# Patient Record
Sex: Male | Born: 1980
Health system: Southern US, Community
[De-identification: ages and names within clinical notes are randomized; demographics above are authoritative.]

## PROBLEM LIST (undated history)

## (undated) DIAGNOSIS — G8929 Other chronic pain: Secondary | ICD-10-CM

## (undated) DIAGNOSIS — M549 Dorsalgia, unspecified: Secondary | ICD-10-CM

## (undated) DIAGNOSIS — M5126 Other intervertebral disc displacement, lumbar region: Secondary | ICD-10-CM

---

## 2005-04-12 ENCOUNTER — Emergency Department (HOSPITAL_COMMUNITY): Admission: EM | Admit: 2005-04-12 | Discharge: 2005-04-12 | Payer: Self-pay | Admitting: Emergency Medicine

## 2005-06-24 ENCOUNTER — Emergency Department (HOSPITAL_COMMUNITY): Admission: EM | Admit: 2005-06-24 | Discharge: 2005-06-24 | Payer: Self-pay | Admitting: Emergency Medicine

## 2005-09-28 ENCOUNTER — Emergency Department (HOSPITAL_COMMUNITY): Admission: EM | Admit: 2005-09-28 | Discharge: 2005-09-28 | Payer: Self-pay | Admitting: Emergency Medicine

## 2007-08-06 ENCOUNTER — Emergency Department (HOSPITAL_COMMUNITY): Admission: EM | Admit: 2007-08-06 | Discharge: 2007-08-06 | Payer: Self-pay | Admitting: Emergency Medicine

## 2009-03-04 ENCOUNTER — Emergency Department (HOSPITAL_COMMUNITY): Admission: EM | Admit: 2009-03-04 | Discharge: 2009-03-04 | Payer: Self-pay | Admitting: Emergency Medicine

## 2009-10-06 ENCOUNTER — Emergency Department (HOSPITAL_COMMUNITY): Admission: EM | Admit: 2009-10-06 | Discharge: 2009-10-07 | Payer: Self-pay | Admitting: Emergency Medicine

## 2011-01-11 ENCOUNTER — Emergency Department (HOSPITAL_COMMUNITY)
Admission: EM | Admit: 2011-01-11 | Discharge: 2011-01-11 | Disposition: A | Discharge status: Home / Self Care | Payer: Medicaid Other | Attending: Emergency Medicine | Admitting: Emergency Medicine

## 2011-01-11 ENCOUNTER — Emergency Department (HOSPITAL_COMMUNITY): Payer: Medicaid Other

## 2011-01-11 DIAGNOSIS — S6990XA Unspecified injury of unspecified wrist, hand and finger(s), initial encounter: Secondary | ICD-10-CM | POA: Insufficient documentation

## 2011-01-11 DIAGNOSIS — W2209XA Striking against other stationary object, initial encounter: Secondary | ICD-10-CM | POA: Insufficient documentation

## 2011-01-11 DIAGNOSIS — M25549 Pain in joints of unspecified hand: Secondary | ICD-10-CM | POA: Insufficient documentation

## 2011-01-11 DIAGNOSIS — Y92009 Unspecified place in unspecified non-institutional (private) residence as the place of occurrence of the external cause: Secondary | ICD-10-CM | POA: Insufficient documentation

## 2011-08-14 ENCOUNTER — Emergency Department (HOSPITAL_COMMUNITY): Payer: Medicaid Other

## 2011-08-14 ENCOUNTER — Emergency Department (HOSPITAL_COMMUNITY)
Admission: EM | Admit: 2011-08-14 | Discharge: 2011-08-15 | Disposition: A | Discharge status: Home / Self Care | Payer: Medicaid Other | Attending: Emergency Medicine | Admitting: Emergency Medicine

## 2011-08-14 ENCOUNTER — Encounter: Payer: Self-pay | Admitting: *Deleted

## 2011-08-14 DIAGNOSIS — R630 Anorexia: Secondary | ICD-10-CM | POA: Insufficient documentation

## 2011-08-14 DIAGNOSIS — R6883 Chills (without fever): Secondary | ICD-10-CM | POA: Insufficient documentation

## 2011-08-14 DIAGNOSIS — R112 Nausea with vomiting, unspecified: Secondary | ICD-10-CM | POA: Insufficient documentation

## 2011-08-14 DIAGNOSIS — R109 Unspecified abdominal pain: Secondary | ICD-10-CM | POA: Insufficient documentation

## 2011-08-14 DIAGNOSIS — F17200 Nicotine dependence, unspecified, uncomplicated: Secondary | ICD-10-CM | POA: Insufficient documentation

## 2011-08-14 DIAGNOSIS — R197 Diarrhea, unspecified: Secondary | ICD-10-CM | POA: Insufficient documentation

## 2011-08-14 LAB — URINALYSIS, ROUTINE W REFLEX MICROSCOPIC
Hgb urine dipstick: NEGATIVE
Protein, ur: NEGATIVE mg/dL
Urobilinogen, UA: 0.2 mg/dL (ref 0.0–1.0)

## 2011-08-14 MED ORDER — SODIUM CHLORIDE 0.9 % IV BOLUS (SEPSIS)
1000.0000 mL | Freq: Once | INTRAVENOUS | Status: AC
  Administered 2011-08-14: 1000 mL via INTRAVENOUS

## 2011-08-14 MED ORDER — ONDANSETRON HCL 4 MG/2ML IJ SOLN
4.0000 mg | Freq: Once | INTRAMUSCULAR | Status: AC
  Administered 2011-08-14: 4 mg via INTRAVENOUS
  Filled 2011-08-14: qty 2

## 2011-08-14 MED ORDER — MORPHINE SULFATE 2 MG/ML IJ SOLN
2.0000 mg | Freq: Once | INTRAMUSCULAR | Status: AC
  Administered 2011-08-14: 2 mg via INTRAVENOUS
  Filled 2011-08-14: qty 1

## 2011-08-14 MED ORDER — DIPHENOXYLATE-ATROPINE 2.5-0.025 MG PO TABS
2.0000 | ORAL_TABLET | Freq: Once | ORAL | Status: AC
  Administered 2011-08-14: 2 via ORAL
  Filled 2011-08-14: qty 2

## 2011-08-14 MED ORDER — PANTOPRAZOLE SODIUM 40 MG IV SOLR
40.0000 mg | Freq: Once | INTRAVENOUS | Status: AC
  Administered 2011-08-14: 40 mg via INTRAVENOUS
  Filled 2011-08-14: qty 40

## 2011-08-14 NOTE — ED Notes (Signed)
Pt reports abdominal cramping at this time, had n/v this morning, & diarrhea this evening.

## 2011-08-14 NOTE — ED Provider Notes (Signed)
History     CSN: [id number] Arrival date & time: 08/14/2011  9:43 PM  Chief Complaint  Patient presents with  . Abdominal Pain    (Consider location/radiation/quality/duration/timing/severity/associated sxs/prior treatment) HPI Comments: Seen 2210  Patient is a 30 y.o. male presenting with abdominal pain. The history is provided by the patient.  Abdominal Pain The primary symptoms of the illness include abdominal pain, nausea and diarrhea. The current episode started 6 to 12 hours ago (began this morning at 7 am as he was leaving work).  The illness is associated with retching. The patient has had a change in bowel habit. Additional symptoms associated with the illness include chills and anorexia. Symptoms associated with the illness do not include diaphoresis, heartburn, constipation, urgency, hematuria, frequency or back pain.    History reviewed. No pertinent past medical history.  History reviewed. No pertinent past surgical history.  No family history on file.  History  Substance Use Topics  . Smoking status: Current Everyday Smoker  . Smokeless tobacco: Not on file  . Alcohol Use: Yes      Review of Systems  Constitutional: Positive for chills. Negative for diaphoresis.  Gastrointestinal: Positive for nausea, abdominal pain, diarrhea and anorexia. Negative for heartburn and constipation.  Genitourinary: Negative for urgency, frequency and hematuria.  Musculoskeletal: Negative for back pain.  All other systems reviewed and are negative.    Allergies  Ibuprofen  Home Medications  No current outpatient prescriptions on file.  BP 106/50  Pulse 81  Temp(Src) 98.4 F (36.9 C) (Oral)  Resp 16  Ht 5\' 7"  (1.702 m)  Wt 153 lb (69.4 kg)  BMI 23.96 kg/m2  SpO2 100%  Physical Exam  Nursing note and vitals reviewed. Constitutional: He is oriented to person, place, and time. He appears well-developed and well-nourished.  HENT:  Head: Normocephalic and atraumatic.   Eyes: EOM are normal.  Neck: Normal range of motion.  Cardiovascular: Normal rate, normal heart sounds and intact distal pulses.   Pulmonary/Chest: Effort normal and breath sounds normal.  Abdominal: Soft. He exhibits no mass. There is tenderness. There is no rebound and no guarding.       Mild epigastric discomfort  Musculoskeletal: Normal range of motion.  Neurological: He is alert and oriented to person, place, and time.  Skin: Skin is warm and dry.    ED Course  Procedures (including critical care time)  Labs Reviewed  URINALYSIS, ROUTINE W REFLEX MICROSCOPIC - Abnormal; Notable for the following:    Specific Gravity, Urine >1.030 (*)    All other components within normal limits   Dg Abd Acute W/chest  08/14/2011  *RADIOLOGY REPORT*  Clinical Data: Abdominal pain, nausea and vomiting for 1 day. 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  ACUTE ABDOMEN SERIES (ABDOMEN 2 VIEW & CHEST 1 VIEW)  Comparison: None.  Findings: Calcified granulomas in the lungs. Normal heart size and pulmonary vascularity.  No focal consolidation in the lungs.  No blunting of costophrenic angles.  Scattered gas and stool in the colon.  No small or large bowel dilatation.  No free intra-abdominal air.  No abnormal air fluid levels.  No radiopaque stones.  IMPRESSION: No evidence of active pulmonary disease.  Nonobstructive bowel gas pattern.  Original Report Authenticated By: Marlon Pel, M.D.   Patient with onset of diarrhea illness associated with nausea and vomiting. Given IVF, antiemeitc, analgesic with good response. Taken PO fluids. Pt feels improved after observation and/or treatment in ED.Pt stable in ED with  no significant deterioration in condition. MDM Reviewed: nursing note and vitals Interpretation: labs and x-ray          Nicoletta Dress. Colon Branch, MD 08/15/11 [phone]

## 2011-08-14 NOTE — ED Notes (Signed)
Pt reports n/v/d starting this am, pt reports v/d has stopped but now c/o abd cramping

## 2011-08-15 MED ORDER — PROMETHAZINE HCL 25 MG PO TABS: 12.5000 mg | ORAL_TABLET | Freq: Four times a day (QID) | ORAL | Status: DC | PRN

## 2011-08-21 LAB — DIFFERENTIAL
Basophils Absolute: 0
Basophils Relative: 0
Monocytes Relative: 0 — ABNORMAL LOW
Neutro Abs: 12.7 — ABNORMAL HIGH
Neutrophils Relative %: 90 — ABNORMAL HIGH

## 2011-08-21 LAB — BASIC METABOLIC PANEL
BUN: 19
CO2: 26
Calcium: 9.2
Creatinine, Ser: 1.05
GFR calc Af Amer: >60

## 2011-08-21 LAB — CBC
MCHC: 32.1
Platelets: 330
RBC: 6.27 — ABNORMAL HIGH
RDW: 16.4 — ABNORMAL HIGH

## 2012-06-30 ENCOUNTER — Emergency Department (HOSPITAL_COMMUNITY)
Admission: EM | Admit: 2012-06-30 | Discharge: 2012-06-30 | Disposition: A | Discharge status: Home / Self Care | Payer: Medicaid Other | Attending: Emergency Medicine | Admitting: Emergency Medicine

## 2012-06-30 ENCOUNTER — Encounter (HOSPITAL_COMMUNITY): Payer: Self-pay | Admitting: Emergency Medicine

## 2012-06-30 DIAGNOSIS — H109 Unspecified conjunctivitis: Secondary | ICD-10-CM

## 2012-06-30 DIAGNOSIS — F17200 Nicotine dependence, unspecified, uncomplicated: Secondary | ICD-10-CM | POA: Insufficient documentation

## 2012-06-30 DIAGNOSIS — H5789 Other specified disorders of eye and adnexa: Secondary | ICD-10-CM | POA: Insufficient documentation

## 2012-06-30 MED ORDER — TOBRAMYCIN 0.3 % OP SOLN
2.0000 [drp] | Freq: Once | OPHTHALMIC | Status: AC
Start: 2012-06-30 — End: 2012-06-30
  Administered 2012-06-30: 2 [drp] via OPHTHALMIC
  Filled 2012-06-30: qty 5

## 2012-06-30 NOTE — ED Provider Notes (Signed)
History     CSN: [id number]  Arrival date & time 06/30/12  2111   First MD Initiated Contact with Patient 06/30/12 2118      Chief Complaint  Patient presents with  . Conjunctivitis    (Consider location/radiation/quality/duration/timing/severity/associated sxs/prior treatment) HPI Comments: Patient c/o itching, drainage and excessive tearing to the right eye.  States he woke up this morning with symptoms.  Right eye was "crusted over" and had to use his fingers to open his eye.  States his vision to the right eye is blurred and sensitive to bright lights.  He denies contact use, dizziness, headache, neck pain or stiffness, fever or recent illness.  He has been using an OTC saline eye wash w/o improvement  Patient is a 31 y.o. male presenting with conjunctivitis. The history is provided by the patient.  Conjunctivitis  The current episode started today. The onset was gradual. The problem occurs continuously. The problem has been gradually worsening. The problem is mild. Nothing relieves the symptoms. The symptoms are aggravated by light (blinking). Associated symptoms include eye itching, photophobia, eye discharge, eye pain and eye redness. Pertinent negatives include no fever, no decreased vision, no double vision, no nausea, no vomiting, no congestion, no ear discharge, no ear pain, no headaches, no hearing loss, no mouth sores, no rhinorrhea, no sore throat, no swollen glands, no neck pain, no neck stiffness, no cough, no URI, no wheezing and no rash. The eye pain is mild. There is pain in the right eye. The eye pain is not associated with movement. The eyelid exhibits swelling. He has been behaving normally. There were no sick contacts. He has received no recent medical care.    History reviewed. No pertinent past medical history.  History reviewed. No pertinent past surgical history.  History reviewed. No pertinent family history.  History  Substance Use Topics  . Smoking status:  Current Everyday Smoker  . Smokeless tobacco: Not on file  . Alcohol Use: Yes     occ      Review of Systems  Constitutional: Negative for fever, activity change and appetite change.  HENT: Negative for hearing loss, ear pain, congestion, sore throat, facial swelling, rhinorrhea, mouth sores, neck pain, sinus pressure and ear discharge.   Eyes: Positive for photophobia, pain, discharge, redness and itching. Negative for double vision.  Respiratory: Negative for cough and wheezing.   Gastrointestinal: Negative for nausea and vomiting.  Skin: Negative for rash.  Neurological: Negative for dizziness, facial asymmetry, speech difficulty, weakness, numbness and headaches.  Hematological: Negative for adenopathy.  Psychiatric/Behavioral: Negative for confusion and decreased concentration.  All other systems reviewed and are negative.    Allergies  Pumpkin flavor and Ibuprofen  Home Medications  No current outpatient prescriptions on file.  BP 116/86  Pulse 73  Temp 98.3 F (36.8 C) (Oral)  Resp 20  Ht 5' 7.5" (1.715 m)  Wt 148 lb (67.132 kg)  BMI 22.84 kg/m2  SpO2 100%  Physical Exam  Nursing note and vitals reviewed. Constitutional: He is oriented to person, place, and time. He appears well-developed and well-nourished. No distress.  HENT:  Head: Normocephalic and atraumatic.  Right Ear: Tympanic membrane and ear canal normal.  Left Ear: Tympanic membrane and ear canal normal.  Mouth/Throat: Uvula is midline, oropharynx is clear and moist and mucous membranes are normal.  Eyes: EOM are normal. Pupils are equal, round, and reactive to light. No foreign bodies found. Right eye exhibits exudate. Right eye exhibits no chemosis and  no hordeolum. No foreign body present in the right eye. Left eye exhibits no chemosis, no discharge, no exudate and no hordeolum. No foreign body present in the left eye. Right conjunctiva is injected. Left conjunctiva is not injected. Left conjunctiva  has no hemorrhage. No scleral icterus.  Neck: Normal range of motion, full passive range of motion without pain and phonation normal. Neck supple. No spinous process tenderness and no muscular tenderness present. No mass and no thyromegaly present.  Cardiovascular: Normal rate, normal heart sounds and intact distal pulses.   No murmur heard. Pulmonary/Chest: Effort normal and breath sounds normal.  Neurological: He is alert and oriented to person, place, and time. He exhibits normal muscle tone. Coordination normal.  Skin: Skin is warm and dry.    ED Course  Procedures (including critical care time)  Labs Reviewed - No data to display No results found.   Visual acuity documented by nursing, reviewed by me.     MDM     Pt is well appearing, vitals stable, no focal neuro deficits.  No visual deficits.  No hx of trauma.  Exudate to the right eye , likely conjunctivitis   Tobramycin applied, remaining bottle dispensed.  Advised to use frequent warm wet compresses to the eye.  Patient advised to followup with Dr. Lita Mains or to return to the ER for any worsening symptoms. Patient verbalized understanding and agrees to care plan  The patient appears reasonably screened and/or stabilized for discharge and I doubt any other medical condition or other [facility] requiring further screening, evaluation, or treatment in the ED at this time prior to discharge.    Sherrel Ploch L. [place], [place] 06/30/12 2151

## 2012-06-30 NOTE — ED Notes (Signed)
Patient states he woke up this morning with right eye pain, draining, and crusting over.

## 2012-07-01 NOTE — ED Provider Notes (Signed)
Medical screening examination/treatment/procedure(s) were performed by non-physician practitioner and as supervising physician I was immediately available for consultation/collaboration.  Danessa Mensch, MD 07/01/12 1626 

## 2012-07-02 NOTE — ED Notes (Signed)
Pt came in today 07/02/12 stating his was told he would be out of work 06/30/12 - 07/03/12. No documentation found for pt to have work note. Nurse gave pt work note for days he stated he was to have note

## 2012-11-20 ENCOUNTER — Encounter (HOSPITAL_COMMUNITY): Payer: Self-pay | Admitting: *Deleted

## 2012-11-20 ENCOUNTER — Emergency Department (HOSPITAL_COMMUNITY)
Admission: EM | Admit: 2012-11-20 | Discharge: 2012-11-20 | Disposition: A | Discharge status: Home / Self Care | Payer: Self-pay | Attending: Emergency Medicine | Admitting: Emergency Medicine

## 2012-11-20 DIAGNOSIS — IMO0002 Reserved for concepts with insufficient information to code with codable children: Secondary | ICD-10-CM | POA: Insufficient documentation

## 2012-11-20 DIAGNOSIS — F17200 Nicotine dependence, unspecified, uncomplicated: Secondary | ICD-10-CM | POA: Insufficient documentation

## 2012-11-20 MED ORDER — AMOXICILLIN-POT CLAVULANATE 875-125 MG PO TABS
1.0000 | ORAL_TABLET | Freq: Once | ORAL | Status: AC
  Administered 2012-11-20: 1 via ORAL
  Filled 2012-11-20: qty 1

## 2012-11-20 MED ORDER — AMOXICILLIN-POT CLAVULANATE 875-125 MG PO TABS: 1.0000 | ORAL_TABLET | Freq: Two times a day (BID) | ORAL | Status: DC

## 2012-11-20 NOTE — ED Notes (Signed)
Wound cleansed with betadine scrub and then saf-clens. Dressing applied. Pt tolerated well.
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
## 2012-12-04 NOTE — ED Provider Notes (Signed)
History/physical exam/procedure(s) were performed by non-physician practitioner and as supervising physician I was immediately available for consultation/collaboration. I have reviewed all notes and am in agreement with care and plan.   Hilario Quarry, MD 12/04/12 [phone]

## 2013-04-26 ENCOUNTER — Encounter (HOSPITAL_COMMUNITY): Payer: Self-pay | Admitting: Emergency Medicine

## 2013-04-26 ENCOUNTER — Emergency Department (HOSPITAL_COMMUNITY)
Admission: EM | Admit: 2013-04-26 | Discharge: 2013-04-26 | Disposition: A | Discharge status: Home / Self Care | Payer: Self-pay | Attending: Emergency Medicine | Admitting: Emergency Medicine

## 2013-04-26 DIAGNOSIS — Y9389 Activity, other specified: Secondary | ICD-10-CM | POA: Insufficient documentation

## 2013-04-26 DIAGNOSIS — Y929 Unspecified place or not applicable: Secondary | ICD-10-CM | POA: Insufficient documentation

## 2013-04-26 DIAGNOSIS — X503XXA Overexertion from repetitive movements, initial encounter: Secondary | ICD-10-CM | POA: Insufficient documentation

## 2013-04-26 DIAGNOSIS — S335XXA Sprain of ligaments of lumbar spine, initial encounter: Secondary | ICD-10-CM | POA: Insufficient documentation

## 2013-04-26 DIAGNOSIS — S39012A Strain of muscle, fascia and tendon of lower back, initial encounter: Secondary | ICD-10-CM

## 2013-04-26 DIAGNOSIS — F17200 Nicotine dependence, unspecified, uncomplicated: Secondary | ICD-10-CM | POA: Insufficient documentation

## 2013-04-26 DIAGNOSIS — Z792 Long term (current) use of antibiotics: Secondary | ICD-10-CM | POA: Insufficient documentation

## 2013-04-26 DIAGNOSIS — Z79899 Other long term (current) drug therapy: Secondary | ICD-10-CM | POA: Insufficient documentation

## 2013-04-26 MED ORDER — ACETAMINOPHEN-CODEINE #3 300-30 MG PO TABS: 1.0000 | ORAL_TABLET | Freq: Four times a day (QID) | ORAL | Status: DC | PRN

## 2013-04-26 MED ORDER — BACLOFEN 10 MG PO TABS: 10.0000 mg | ORAL_TABLET | Freq: Three times a day (TID) | ORAL | Status: AC

## 2013-04-26 MED ORDER — DICLOFENAC SODIUM 75 MG PO TBEC: 75.0000 mg | DELAYED_RELEASE_TABLET | Freq: Two times a day (BID) | ORAL | Status: DC

## 2013-04-26 NOTE — ED Notes (Signed)
Pt c/o back pain radiating down right leg.

## 2013-04-26 NOTE — ED Provider Notes (Signed)
History     CSN: [id number]  Arrival date & time 04/26/13  7829   First MD Initiated Contact with Patient 04/26/13 0756      Chief Complaint  Patient presents with  . Back Pain    (Consider location/radiation/quality/duration/timing/severity/associated sxs/prior treatment) Patient is a 32 y.o. male presenting with back pain. The history is provided by the patient.  Back Pain Location:  Lumbar spine Quality:  Cramping, stiffness and aching Radiates to:  Does not radiate Pain severity:  Moderate Pain is:  Same all the time Onset quality:  Gradual Duration:  3 hours Timing:  Constant Progression:  Worsening Chronicity:  New Context: lifting heavy objects and twisting   Relieved by:  Nothing Worsened by:  Movement, standing and twisting Ineffective treatments:  None tried Associated symptoms: no abdominal pain, no bladder incontinence, no bowel incontinence, no chest pain, no dysuria and no perianal numbness   Risk factors: no lack of exercise, not obese and no vascular disease     History reviewed. No pertinent past medical history.  History reviewed. No pertinent past surgical history.  No family history on file.  History  Substance Use Topics  . Smoking status: Current Every Day Smoker  . Smokeless tobacco: Not on file  . Alcohol Use: Yes     Comment: occ      Review of Systems  Constitutional: Negative for activity change.       All ROS Neg except as noted in HPI  HENT: Negative for nosebleeds and neck pain.   Eyes: Negative for photophobia and discharge.  Respiratory: Negative for cough, shortness of breath and wheezing.   Cardiovascular: Negative for chest pain and palpitations.  Gastrointestinal: Negative for abdominal pain, blood in stool and bowel incontinence.  Genitourinary: Negative for bladder incontinence, dysuria, frequency and hematuria.  Musculoskeletal: Positive for back pain. Negative for arthralgias.  Skin: Negative.   Neurological:  Negative for dizziness, seizures and speech difficulty.  Psychiatric/Behavioral: Negative for hallucinations and confusion.    Allergies  Pumpkin flavor and Ibuprofen  Home Medications   Current Outpatient Rx  Name  Route  Sig  Dispense  Refill  . acetaminophen-codeine (TYLENOL #3) 300-30 MG per tablet   Oral   Take 1-2 tablets by mouth every 6 (six) hours as needed for pain.   15 tablet   0     Please take this medication with food   . amoxicillin-clavulanate (AUGMENTIN) 875-125 MG per tablet   Oral   Take 1 tablet by mouth 2 (two) times daily. For 5 days   10 tablet   0   . baclofen (LIORESAL) 10 MG tablet   Oral   Take 1 tablet (10 mg total) by mouth 3 (three) times daily.   21 each   0   . diclofenac (VOLTAREN) 75 MG EC tablet   Oral   Take 1 tablet (75 mg total) by mouth 2 (two) times daily.   12 tablet   0     Please take this medication after a meal     BP 124/73  Pulse 65  Temp(Src) 97.9 F (36.6 C)  Resp 18  Ht 5' 7.5" (1.715 m)  Wt 143 lb (64.864 kg)  BMI 22.05 kg/m2  SpO2 99%  Physical Exam  Nursing note and vitals reviewed. Constitutional: He is oriented to person, place, and time. He appears well-developed and well-nourished.  Non-toxic appearance.  HENT:  Head: Normocephalic.  Right Ear: Tympanic membrane and external ear normal.  Left  Ear: Tympanic membrane and external ear normal.  Eyes: EOM and lids are normal. Pupils are equal, round, and reactive to light.  Neck: Normal range of motion. Neck supple. Carotid bruit is not present.  Cardiovascular: Normal rate, regular rhythm, normal heart sounds, intact distal pulses and normal pulses.   Pulmonary/Chest: Breath sounds normal. No respiratory distress.  Abdominal: Soft. Bowel sounds are normal. There is no tenderness. There is no guarding.  Musculoskeletal:       Lumbar back: He exhibits decreased range of motion, tenderness and spasm.  Lymphadenopathy:       Head (right side): No  submandibular adenopathy present.       Head (left side): No submandibular adenopathy present.    He has no cervical adenopathy.  Neurological: He is alert and oriented to person, place, and time. He has normal strength. No cranial nerve deficit or sensory deficit.  Skin: Skin is warm and dry.  Psychiatric: He has a normal mood and affect. His speech is normal.    ED Course  Procedures (including critical care time)  Labs Reviewed - No data to display No results found.   1. Lumbar strain, initial encounter       MDM  I have reviewed nursing notes, vital signs, and all appropriate lab and imaging results for this patient. Pt sustained a muscle strain of the lumbar area. No gross neuro deficits. Pt advised to alternate heat and ice. Advised to use tylenol #3 for pain. Baclofen for spasm. Diclofenac for inflammation. Pt to follow up with PCP if not improving.       Kathie Dike, PA-C 04/26/13 [phone]

## 2013-04-26 NOTE — ED Notes (Signed)
Pt given work note upon d/c and note to attend mandatory meeting.

## 2013-04-26 NOTE — ED Provider Notes (Signed)
Medical screening examination/treatment/procedure(s) were performed by non-physician practitioner and as supervising physician I was immediately available for consultation/collaboration.  Lauralee Waters, MD 04/26/13 0907 

## 2014-04-01 ENCOUNTER — Emergency Department (HOSPITAL_COMMUNITY)
Admission: EM | Admit: 2014-04-01 | Discharge: 2014-04-01 | Disposition: A | Discharge status: Home / Self Care | Payer: Medicaid Other | Attending: Emergency Medicine | Admitting: Emergency Medicine

## 2014-04-01 ENCOUNTER — Emergency Department (HOSPITAL_COMMUNITY): Payer: Medicaid Other

## 2014-04-01 ENCOUNTER — Encounter (HOSPITAL_COMMUNITY): Payer: Self-pay | Admitting: Emergency Medicine

## 2014-04-01 DIAGNOSIS — F17200 Nicotine dependence, unspecified, uncomplicated: Secondary | ICD-10-CM | POA: Insufficient documentation

## 2014-04-01 DIAGNOSIS — Y9389 Activity, other specified: Secondary | ICD-10-CM | POA: Insufficient documentation

## 2014-04-01 DIAGNOSIS — Z791 Long term (current) use of non-steroidal anti-inflammatories (NSAID): Secondary | ICD-10-CM | POA: Insufficient documentation

## 2014-04-01 DIAGNOSIS — S61409A Unspecified open wound of unspecified hand, initial encounter: Secondary | ICD-10-CM | POA: Insufficient documentation

## 2014-04-01 DIAGNOSIS — Y929 Unspecified place or not applicable: Secondary | ICD-10-CM | POA: Insufficient documentation

## 2014-04-01 DIAGNOSIS — T148XXA Other injury of unspecified body region, initial encounter: Secondary | ICD-10-CM

## 2014-04-01 DIAGNOSIS — Z79899 Other long term (current) drug therapy: Secondary | ICD-10-CM | POA: Insufficient documentation

## 2014-04-01 DIAGNOSIS — W268XXA Contact with other sharp object(s), not elsewhere classified, initial encounter: Secondary | ICD-10-CM | POA: Insufficient documentation

## 2014-04-01 MED ORDER — HYDROCODONE-ACETAMINOPHEN 5-325 MG PO TABS: 1.0000 | ORAL_TABLET | Freq: Four times a day (QID) | ORAL | Status: DC | PRN

## 2014-04-01 MED ORDER — HYDROCODONE-ACETAMINOPHEN 5-325 MG PO TABS
1.0000 | ORAL_TABLET | Freq: Once | ORAL | Status: AC
Start: 2014-04-01 — End: 2014-04-01
  Administered 2014-04-01: 1 via ORAL
  Filled 2014-04-01: qty 1

## 2014-04-01 MED ORDER — AMOXICILLIN-POT CLAVULANATE 875-125 MG PO TABS: 1.0000 | ORAL_TABLET | Freq: Two times a day (BID) | ORAL | Status: DC

## 2014-04-01 MED ORDER — DOUBLE ANTIBIOTIC 500-10000 UNIT/GM EX OINT
TOPICAL_OINTMENT | Freq: Once | CUTANEOUS | Status: AC
  Administered 2014-04-01: 1 via TOPICAL
  Filled 2014-04-01: qty 1

## 2014-04-01 MED ORDER — AMOXICILLIN-POT CLAVULANATE 875-125 MG PO TABS
1.0000 | ORAL_TABLET | Freq: Once | ORAL | Status: AC
  Administered 2014-04-01: 1 via ORAL
  Filled 2014-04-01: qty 1

## 2014-04-01 MED ORDER — LIDOCAINE HCL (PF) 1 % IJ SOLN
INTRAMUSCULAR | Status: AC
  Administered 2014-04-01: 10:00:00
  Filled 2014-04-01: qty 5
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
## 2014-04-01 NOTE — Discharge Instructions (Signed)
Puncture Wound Take the antibiotic as prescribed. Take Tylenol for mild pain or the pain medicine prescribed for bad pain. Wash wound daily with soap and water and then placed a thin layer of bacitracin ointment over the wound and cover with a bandage. Get your wound rechecked in 3 days at an urgent care Center. A puncture wound happens when a sharp object pokes a hole in the skin. A puncture wound can cause an infection because germs can go under the skin during the injury. HOME CARE   Change your bandage (dressing) once a day, or as told by your doctor. If the bandage sticks, soak it in water.  Keep all doctor visits as told.  Only take medicine as told by your doctor.  Take your medicine (antibiotics) as told. Finish them even if you start to feel better. You may need a tetanus shot if:  You cannot remember when you had your last tetanus shot.  You have never had a tetanus shot. If you need a tetanus shot and you choose not to have one, you may get tetanus. Sickness from tetanus can be serious. You may need a rabies shot if an animal bite caused your wound. GET HELP RIGHT AWAY IF:   Your wound is red, puffy (swollen), or painful.  You see red lines on the skin near the wound.  You have a bad smell coming from the wound or bandage.  You have yellowish-white fluid (pus) coming from the wound.  Your medicine is not working.  You notice an object in the wound.  You have a fever.  You have severe pain.  You have trouble breathing.  You feel dizzy or pass out (faint).  You keep throwing up (vomiting).  You lose feeling (numbness) in your arm or leg, or you cannot move your arm or leg.  Your problems get worse. MAKE SURE YOU:   Understand these instructions.  Will watch your condition.  Will get help right away if you are not doing well or get worse. Document Released: 08/05/2008 Document Revised: 01/19/2012 Document Reviewed: 04/15/2011 [facility] Patient Information  2014 [place], [place].
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
## 2014-05-16 ENCOUNTER — Encounter (HOSPITAL_COMMUNITY): Payer: Self-pay | Admitting: Emergency Medicine

## 2014-05-16 ENCOUNTER — Emergency Department (HOSPITAL_COMMUNITY)
Admission: EM | Admit: 2014-05-16 | Discharge: 2014-05-16 | Disposition: A | Discharge status: Home / Self Care | Payer: Medicaid Other | Attending: Emergency Medicine | Admitting: Emergency Medicine

## 2014-05-16 ENCOUNTER — Emergency Department (HOSPITAL_COMMUNITY): Payer: Medicaid Other

## 2014-05-16 DIAGNOSIS — R5381 Other malaise: Secondary | ICD-10-CM | POA: Diagnosis not present

## 2014-05-16 DIAGNOSIS — M5126 Other intervertebral disc displacement, lumbar region: Secondary | ICD-10-CM | POA: Insufficient documentation

## 2014-05-16 DIAGNOSIS — Z792 Long term (current) use of antibiotics: Secondary | ICD-10-CM | POA: Insufficient documentation

## 2014-05-16 DIAGNOSIS — M79609 Pain in unspecified limb: Secondary | ICD-10-CM | POA: Insufficient documentation

## 2014-05-16 DIAGNOSIS — R5383 Other fatigue: Secondary | ICD-10-CM

## 2014-05-16 DIAGNOSIS — F17200 Nicotine dependence, unspecified, uncomplicated: Secondary | ICD-10-CM | POA: Insufficient documentation

## 2014-05-16 DIAGNOSIS — M545 Low back pain, unspecified: Secondary | ICD-10-CM | POA: Diagnosis present

## 2014-05-16 MED ORDER — CYCLOBENZAPRINE HCL 10 MG PO TABS: 10.0000 mg | ORAL_TABLET | Freq: Two times a day (BID) | ORAL | Status: DC | PRN

## 2014-05-16 MED ORDER — OXYCODONE-ACETAMINOPHEN 5-325 MG PO TABS
1.0000 | ORAL_TABLET | Freq: Once | ORAL | Status: AC
  Administered 2014-05-16: 1 via ORAL
  Filled 2014-05-16: qty 1

## 2014-05-16 MED ORDER — PREDNISONE (PAK) 10 MG PO TABS: ORAL_TABLET | Freq: Every day | ORAL | Status: DC

## 2014-05-16 MED ORDER — OXYCODONE-ACETAMINOPHEN 7.5-325 MG PO TABS: 1.0000 | ORAL_TABLET | Freq: Four times a day (QID) | ORAL | Status: DC | PRN

## 2014-05-16 MED ORDER — CYCLOBENZAPRINE HCL 10 MG PO TABS
10.0000 mg | ORAL_TABLET | Freq: Once | ORAL | Status: AC
  Administered 2014-05-16: 10 mg via ORAL
  Filled 2014-05-16: qty 1

## 2014-05-16 NOTE — ED Notes (Signed)
Pt c/o lower back pain that radiates down both legs x1 month. Pt states he went to [facility] previously but symptoms have worsened.

## 2014-05-16 NOTE — Discharge Instructions (Signed)
Do not take the muscle relaxant or narcotic if you are driving because they will make you sleepy.   Herniated Disk The bones of your spinal column (vertebrae) protect your spinal cord and nerves that go into your arms and legs. The vertebrae are separated by disks that cushion the spinal column and put space between your vertebrae. This allows movement between the vertebrae, which allows you to bend, rotate, and move your body from side to side. Sometimes, the disks move out of place (herniate) or break open (rupture) from injury or strain. The most common area for a disk herniation is in the lower back (lumbar area). Sometimes herniation occurs in the neck (cervical) disks.  CAUSES  As we grow older, the strong, fibrous cords that connect the vertebrae and support and surround the disks (ligaments) start to weaken. A strain on the back may cause a break in the disk ligaments. RISK FACTORS Herniated disks occur most often in men who are aged 18 years to 35 years, usually after strenuous activity. Other risk factors include conditions present at birth (congenital) that affect the size of the lumbar spinal canal. Additionally, a narrowing of the areas where the nerves exit the spinal canal can occur as you age. SYMPTOMS  Symptoms of a herniated disk vary. You may have weakness in certain muscles. This weakness can include difficulty lifting your leg or arm, difficulty standing on your toes on one side, or difficulty squeezing tightly with one of your hands. You may have numbness. You may feel a mild tingling, dull ache, or a burning or pulsating pain. In some cases, the pain is severe enough that you are unable to move. The pain most often occurs on one side of the body. The pain often starts slowly. It may get worse:  After you sit or stand.  At night.  When you sneeze, cough, or laugh.  When you bend backwards or walk more than a few yards. The pain, numbness, or weakness will often go away or  improve a lot over a period of weeks to months. Herniated lumbar disk Symptoms of a herniated lumbar disk may include sharp pain in one part of your leg, hip, or buttocks and numbness in other parts. You also may feel pain or numbness on the back of your calf or the top or sole of your foot. The same leg also may feel weak. Herniated cervical disk Symptoms of a herniated cervical disk may include pain when you move your neck, deep pain near or over your shoulder blade, or pain that moves to your upper arm, forearm, or fingers. DIAGNOSIS  To diagnose a herniated disk, your caregiver will perform a physical exam. Your caregiver also may perform diagnostic tests to see your disk or to test the reaction of your muscles and the function of your nerves. During the physical exam, your caregiver may ask you to:  Sit, stand, and walk. While you walk, your caregiver may ask you to try walking on your toes and then your heels.  Bend forward, backward, and sideways.  Raise your shoulders, elbow, wrist, and fingers and check your strength during these tasks. Your caregiver will check for:  Numbness or loss of feeling.  Muscle reflexes, which may be slower or missing.  Muscle strength, which may be weaker.  Posture or the way your spine curves. Diagnostic tests that may be done include:  A spinal X-ray exam to rule out other causes of back pain.  Magnetic resonance imaging (MRI)  or computed tomography (CT) scan, which will show if the herniated disk is pressing on your spinal canal.  Electromyography. This is sometimes used to identify the specific area of nerve involvement. TREATMENT  Initial treatment for a herniated disk is a short period of rest with medicines for pain. Pain medicines can include nonsteroidal anti-inflammatory medicines (NSAIDs), muscle relaxants for back spasms, and (rarely) narcotic pain medicine for severe pain that does not respond to NSAID use. Bed rest is often limited to  1 or 2 days at the most because prolonged rest can delay recovery. When the herniation involves the lower back, sitting should be avoided as much as possible because sitting increases pressure on the ruptured disk. Sometimes a soft neck collar will be prescribed for a few days to weeks to help support your neck in the case of a cervical herniation. Physical therapy is often prescribed for patients with disk disease. Physical therapists will teach you how to properly lift, dress, walk, and perform other activities. They will work on strengthening the muscles that help support your spine. In some cases, physical therapy alone is not enough to treat a herniated disk. Steroid injections along the involved nerve root may be needed to help control pain. The steroid is injected in the area of the herniated disk and helps by reducing swelling around the disk. Sometimes surgery is the best option to treat a herniated disk.  SEEK IMMEDIATE MEDICAL CARE IF:   You have numbness, tingling, weakness, or problems with the use of your arms or legs.  You have severe headaches that are not relieved with the use of medicines.  You notice a change in your bowel or bladder control.  You have increasing pain in any areas of your body.  You experience shortness of breath, dizziness, or fainting. MAKE SURE YOU:   Understand these instructions.  Will watch your condition.  Will get help right away if you are not doing well or get worse. Document Released: 10/24/2000 Document Revised: 01/19/2012 Document Reviewed: 09/30/2013 [facility] Patient Information 2015 [place]. This information is not intended to replace advice given to you by your health care provider. Make sure you discuss any questions you have with your health care provider.

## 2014-05-16 NOTE — ED Provider Notes (Signed)
CSN: [id number]     Arrival date & time 05/16/14  1019 History   First MD Initiated Contact with Patient 05/16/14 1035     Chief Complaint  Patient presents with  . Back Pain     (Consider location/radiation/quality/duration/timing/severity/associated sxs/prior Treatment) Patient is a 33 y.o. male presenting with back pain. The history is provided by the patient.  Back Pain Location:  Lumbar spine Radiates to:  L thigh and R thigh Pain severity:  Moderate Pain is:  Worse during the night Onset quality:  Gradual Duration:  1 month Timing:  Constant Progression:  Worsening Chronicity:  New Context: physical stress   Relieved by:  Nothing Worsened by:  Movement, standing and bending Associated symptoms: leg pain and weakness   Associated symptoms: no bladder incontinence, no bowel incontinence and no dysuria    Troy Ramos is a 33 y.o. male who presents to the ED with severe lower back pain that started about a month ago. He went to occupational health in [place] and was examined and told he may have a muscle strain and to take tylenol. The patient has been taking tylenol without relief. The pain has increases and now radiates down both legs but more pain on the right than the left. The pain originates int the mid lower back. He has not lost control of bladder or bowels but last night when he stood up he almost urinated on himself. He complains of weakness on the right side. He denies any medical problems. He states he was in the [employer] for 8 years and in war and has never had any back or medical problems.    History reviewed. No pertinent past medical history. History reviewed. No pertinent past surgical history. History reviewed. No pertinent family history. History  Substance Use Topics  . Smoking status: Current Every Day Smoker    Types: Cigarettes  . Smokeless tobacco: Not on file  . Alcohol Use: Yes     Comment: occ    Review of Systems  Gastrointestinal: Negative for  bowel incontinence.  Genitourinary: Negative for bladder incontinence and dysuria.  Musculoskeletal: Positive for back pain.  Neurological: Positive for weakness.      Allergies  Pumpkin flavor and Ibuprofen  Home Medications   Prior to Admission medications   Medication Sig Start Date End Date Taking? Authorizing Provider  amoxicillin-clavulanate (AUGMENTIN) 875-125 MG per tablet Take 1 tablet by mouth 2 (two) times daily. 04/01/14   Doug Sou, MD  HYDROcodone-acetaminophen (NORCO) 5-325 MG per tablet Take 1 tablet by mouth every 6 (six) hours as needed for severe pain. 04/01/14   Doug Sou, MD   BP 126/81  Pulse 77  Temp(Src) 98 F (36.7 C) (Oral)  Resp 18  SpO2 99% Physical Exam  Nursing note and vitals reviewed. Constitutional: He is oriented to person, place, and time. He appears well-developed and well-nourished. No distress.  HENT:  Head: Normocephalic and atraumatic.  Eyes: EOM are normal. Pupils are equal, round, and reactive to light.  Neck: Normal range of motion. Neck supple.  Cardiovascular: Normal rate and regular rhythm.   Pulmonary/Chest: Effort normal. No respiratory distress. He has no wheezes. He has no rales.  Abdominal: Soft. Bowel sounds are normal. There is no tenderness.  Musculoskeletal: Normal range of motion. He exhibits no edema.       Lumbar back: He exhibits tenderness. He exhibits normal range of motion, no deformity, no spasm and normal pulse.       Back:  Neurological: He is alert and oriented to person, place, and time. He has normal strength. No cranial nerve deficit or sensory deficit. Coordination and gait normal.  Reflex Scores:      Bicep reflexes are 2+ on the right side and 2+ on the left side.      Brachioradialis reflexes are 2+ on the right side and 2+ on the left side.      Patellar reflexes are 2+ on the right side and 2+ on the left side.      Achilles reflexes are 2+ on the right side and 2+ on the left side. Skin:  Skin is warm and dry.  Psychiatric: He has a normal mood and affect. His behavior is normal.   Mr Lumbar Spine Wo Contrast  05/16/2014   CLINICAL DATA:  Low back pain. No injury. Lumbar spine pain with bilateral leg pain.  EXAM: MRI LUMBAR SPINE WITHOUT CONTRAST  TECHNIQUE: Multiplanar, multisequence MR imaging of the lumbar spine was performed. No intravenous contrast was administered.  COMPARISON:  08/14/2011.  FINDINGS: Segmentation: The numbering convention used for this exam termed L5-S1 as the last intervertebral disc space. Five lumbar type vertebral bodies identified on prior radiographs 2012.  Alignment: Straightening of the normal lumbar lordosis. No spondylolisthesis. No pars defects.  Vertebrae: Vertebral body height is preserved. There is increased T2 signal in both sacral alae adjacent to the SI joints, compatible with bone marrow edema.  Conus medullaris: Normal at L1-L2.  Paraspinal tissues: Normal.  Disc levels:  T12-L1 through L3-L4 levels are normal.  L4-L5 shows shallow disc bulging without stenosis.  L5-S1: There is a broad-based LEFT eccentric disc protrusion that extends from the RIGHT central region to the LEFT foraminal region. This encroaches on the descending LEFT S1 nerve. This also produces mild to moderate LEFT foraminal stenosis potentially affecting the exiting LEFT L5 nerve. The RIGHT lateral recess and foramen appear adequately patent. Central canal patent.  IMPRESSION: 1. L5-S1 LEFT eccentric disc protrusion producing LEFT lateral recess stenosis and mild to moderate LEFT foraminal stenosis. Stenosis potentially affects the descending LEFT S1 nerve and LEFT L5 nerve in the foramen. 2. Incompletely visualized bone marrow edema in both sacral alae, right-greater-than-left. This could be associated with sacroiliitis. Sacral insufficiency fracture is unlikely in this age group. Consider sacral MRI with and without contrast for further assessment.   Electronically Signed   By:  Andreas NewportGeoffrey  Lamke M.D.   On: 05/16/2014 12:37    ED Course  Procedures  After pain management patient is feeling better.  Consult with Neurosurgery and he will follow the patient in the office.  MDM  33 y.o. male with severe lower back pain that radiates to both legs for the past few weeks after working in the yard. Will treat with steroid dose pack and Percocet and he will follow up with neurosurgery. Stable for discharge without neuro deficits. He will follow up in the Neuro office later this week. Discussed with the patient and all questioned fully answered.    Medication List    TAKE these medications       cyclobenzaprine 10 MG tablet  Commonly known as:  FLEXERIL  Take 1 tablet (10 mg total) by mouth 2 (two) times daily as needed for muscle spasms.     oxyCODONE-acetaminophen 7.5-325 MG per tablet  Commonly known as:  PERCOCET  Take 1 tablet by mouth every 6 (six) hours as needed for pain.     predniSONE 10 MG tablet  Commonly known  asMardee Postin:  STERAPRED UNI-PAK  Take by mouth daily. Take 6 tablets today then 5, 4, 3, 2, 1      ASK your doctor about these medications       acetaminophen 500 MG tablet  Commonly known as:  TYLENOL  Take 1,000 mg by mouth every 4 (four) hours as needed. pain           Janne NapoleonHope M Martena Emanuele, [place]NP 05/16/14 [phone]

## 2014-05-18 NOTE — ED Provider Notes (Signed)
Medical screening examination/treatment/procedure(s) were conducted as a shared visit with non-physician practitioner(s) and myself.  I personally evaluated the patient during the encounter.   EKG Interpretation None     MRI of lumbar spine shows a L5-S1 left disc protrusion.  No bowel or bladder incontinence. Pain medicine, steroids, refer to neurosurgery.  Donnetta HutchingBrian Anabia Weatherwax, MD 05/18/14 [phone]

## 2014-08-09 ENCOUNTER — Emergency Department (HOSPITAL_COMMUNITY)
Admission: EM | Admit: 2014-08-09 | Discharge: 2014-08-09 | Disposition: A | Discharge status: Home / Self Care | Payer: Medicaid Other | Attending: Emergency Medicine | Admitting: Emergency Medicine

## 2014-08-09 ENCOUNTER — Encounter (HOSPITAL_COMMUNITY): Payer: Self-pay | Admitting: Emergency Medicine

## 2014-08-09 DIAGNOSIS — M5136 Other intervertebral disc degeneration, lumbar region: Secondary | ICD-10-CM

## 2014-08-09 DIAGNOSIS — M5441 Lumbago with sciatica, right side: Secondary | ICD-10-CM

## 2014-08-09 DIAGNOSIS — M549 Dorsalgia, unspecified: Secondary | ICD-10-CM | POA: Diagnosis present

## 2014-08-09 DIAGNOSIS — M5430 Sciatica, unspecified side: Secondary | ICD-10-CM | POA: Diagnosis not present

## 2014-08-09 DIAGNOSIS — M519 Unspecified thoracic, thoracolumbar and lumbosacral intervertebral disc disorder: Secondary | ICD-10-CM | POA: Insufficient documentation

## 2014-08-09 DIAGNOSIS — F17200 Nicotine dependence, unspecified, uncomplicated: Secondary | ICD-10-CM | POA: Insufficient documentation

## 2014-08-09 HISTORY — DX: Other intervertebral disc displacement, lumbar region: M51.26

## 2014-08-09 MED ORDER — KETOROLAC TROMETHAMINE 60 MG/2ML IM SOLN
60.0000 mg | Freq: Once | INTRAMUSCULAR | Status: AC
  Administered 2014-08-09: 60 mg via INTRAMUSCULAR
  Filled 2014-08-09: qty 2

## 2014-08-09 MED ORDER — HYDROCODONE-ACETAMINOPHEN 5-325 MG PO TABS
2.0000 | ORAL_TABLET | Freq: Once | ORAL | Status: AC
  Administered 2014-08-09: 2 via ORAL
  Filled 2014-08-09: qty 2

## 2014-08-09 MED ORDER — ONDANSETRON HCL 4 MG PO TABS
4.0000 mg | ORAL_TABLET | Freq: Once | ORAL | Status: AC
  Administered 2014-08-09: 4 mg via ORAL
  Filled 2014-08-09: qty 1

## 2014-08-09 MED ORDER — HYDROCODONE-ACETAMINOPHEN 5-325 MG PO TABS
1.0000 | ORAL_TABLET | ORAL | Status: DC | PRN
Start: 2014-08-09 — End: 2014-12-09

## 2014-08-09 MED ORDER — DEXAMETHASONE SODIUM PHOSPHATE 4 MG/ML IJ SOLN
8.0000 mg | Freq: Once | INTRAMUSCULAR | Status: AC
  Administered 2014-08-09: 8 mg via INTRAMUSCULAR
  Filled 2014-08-09: qty 2

## 2014-08-09 MED ORDER — DEXAMETHASONE 4 MG PO TABS: ORAL_TABLET | ORAL | Status: DC

## 2014-08-09 MED ORDER — METHOCARBAMOL 500 MG PO TABS
500.0000 mg | ORAL_TABLET | Freq: Three times a day (TID) | ORAL | Status: DC
Start: 2014-08-09 — End: 2014-12-09

## 2014-08-09 NOTE — ED Notes (Signed)
Pt states he was seen in the ED about 2 months ago and dx with a herniated lumbar disk and followed up with a spine center and has a schedule for epidural pain control in about a month but is having uncontrolled lower pain, with numbness and tingling bilateral legs. Pt denies any loss of bowel of bladder.

## 2014-08-09 NOTE — ED Provider Notes (Signed)
CSN: [id number]     Arrival date & time 08/09/14  1002 History   First MD Initiated Contact with Patient 08/09/14 1008     Chief Complaint  Patient presents with  . Back Pain     (Consider location/radiation/quality/duration/timing/severity/associated sxs/prior Treatment) HPI Comments: PCP: Health Dept.  Patient is a 33 y.o. male presenting with back pain. The history is provided by the patient.  Back Pain Location:  Lumbar spine Quality:  Aching Radiates to:  L thigh and R thigh Pain severity:  Moderate Pain is:  Same all the time Onset quality:  Gradual Duration:  2 months Timing:  Intermittent Progression:  Worsening Chronicity:  Recurrent Relieved by:  Nothing Worsened by:  Sitting, lying down and sneezing Ineffective treatments:  None tried Associated symptoms: no abdominal pain, no bladder incontinence, no bowel incontinence, no chest pain, no dysuria, no numbness and no perianal numbness     Past Medical History  Diagnosis Date  . Herniated lumbar intervertebral disc    History reviewed. No pertinent past surgical history. No family history on file. History  Substance Use Topics  . Smoking status: Current Every Day Smoker    Types: Cigarettes  . Smokeless tobacco: Not on file  . Alcohol Use: Yes     Comment: occ    Review of Systems  Constitutional: Negative for activity change.       All ROS Neg except as noted in HPI  HENT: Negative for nosebleeds.   Eyes: Negative for photophobia and discharge.  Respiratory: Negative for cough, shortness of breath and wheezing.   Cardiovascular: Negative for chest pain and palpitations.  Gastrointestinal: Negative for abdominal pain, blood in stool and bowel incontinence.  Genitourinary: Negative for bladder incontinence, dysuria, frequency and hematuria.  Musculoskeletal: Positive for back pain. Negative for arthralgias and neck pain.  Skin: Negative.   Neurological: Negative for dizziness, seizures, speech  difficulty and numbness.  Psychiatric/Behavioral: Negative for hallucinations and confusion.      Allergies  Pumpkin flavor and Ibuprofen  Home Medications   Prior to Admission medications   Medication Sig Start Date End Date Taking? Authorizing Provider  cyclobenzaprine (FLEXERIL) 10 MG tablet Take 1 tablet (10 mg total) by mouth 2 (two) times daily as needed for muscle spasms. 05/16/14  Yes Hope Orlene Och, NP  HYDROcodone-acetaminophen (NORCO/VICODIN) 5-325 MG per tablet Take 1 tablet by mouth every 4 (four) hours as needed for moderate pain.   Yes Historical Provider, MD  acetaminophen (TYLENOL) 500 MG tablet Take 1,000 mg by mouth every 4 (four) hours as needed. pain    Historical Provider, MD   BP 130/94  Pulse 86  Temp(Src) 97.7 F (36.5 C) (Oral)  Ht 5\' 7"  (1.702 m)  Wt 144 lb (65.318 kg)  BMI 22.55 kg/m2  SpO2 100% Physical Exam  Nursing note and vitals reviewed. Constitutional: He is oriented to person, place, and time. He appears well-developed and well-nourished.  Non-toxic appearance.  HENT:  Head: Normocephalic.  Right Ear: Tympanic membrane and external ear normal.  Left Ear: Tympanic membrane and external ear normal.  Eyes: EOM and lids are normal. Pupils are equal, round, and reactive to light.  Neck: Normal range of motion. Neck supple. Carotid bruit is not present.  Cardiovascular: Normal rate, regular rhythm, normal heart sounds, intact distal pulses and normal pulses.   Pulmonary/Chest: Breath sounds normal. No respiratory distress.  Abdominal: Soft. Bowel sounds are normal. There is no tenderness. There is no guarding.  Musculoskeletal:  Lumbar back: He exhibits decreased range of motion, tenderness, pain and spasm.  Lymphadenopathy:       Head (right side): No submandibular adenopathy present.       Head (left side): No submandibular adenopathy present.    He has no cervical adenopathy.  Neurological: He is alert and oriented to person, place, and  time. He has normal strength. No cranial nerve deficit or sensory deficit.  No gross neuro deficits. Gait is steady and intact.  Skin: Skin is warm and dry.  Psychiatric: He has a normal mood and affect. His speech is normal.    ED Course  Procedures (including critical care time) Labs Review Labs Reviewed - No data to display  Imaging Review No results found.   EKG Interpretation None      MDM   Vital signs are within normal limits. Pulse oximetry is 100% on room air. Within normal limits by my interpretation. No gross neurologic deficits appreciated at this time. Examination is consistent with an exacerbation of previous degenerative disc problems. Prescription for Decadron, Norco, and Robaxin given to the patient. Patient instructed to see his primary physician for additional workup, as well as management. Patient will return to the emergency department if any changes, problems, or concerns.    Final diagnoses:  Bilateral low back pain with right-sided sciatica  DDD (degenerative disc disease), lumbar    *I have reviewed nursing notes, vital signs, and all appropriate lab and imaging results for this patient.Kathie Dike**    Henry Utsey M Blu Mcglaun, PA-C 08/09/14 [phone]

## 2014-08-09 NOTE — Discharge Instructions (Signed)
Degenerative Disk Disease  Please see your MD at the Health Dept for assistance with your discomfort. Please return to the Emergency Dept if any Emergent changes or problem. Degenerative disk disease is a condition caused by the changes that occur in the cushions of the backbone (spinal disks) as you grow older. Spinal disks are soft and compressible disks located between the bones of the spine (vertebrae). They act like shock absorbers. Degenerative disk disease can affect the whole spine. However, the neck and lower back are most commonly affected. Many changes can occur in the spinal disks with aging, such as:  The spinal disks may dry and shrink.  Small tears may occur in the tough, outer covering of the disk (annulus).  The disk space may become smaller due to loss of water.  Abnormal growths in the bone (spurs) may occur. This can put pressure on the nerve roots exiting the spinal canal, causing pain.  The spinal canal may become narrowed. CAUSES  Degenerative disk disease is a condition caused by the changes that occur in the spinal disks with aging. The exact cause is not known, but there is a genetic basis for many patients. Degenerative changes can occur due to loss of fluid in the disk. This makes the disk thinner and reduces the space between the backbones. Small cracks can develop in the outer layer of the disk. This can lead to the breakdown of the disk. You are more likely to get degenerative disk disease if you are overweight. Smoking cigarettes and doing heavy work such as weightlifting can also increase your risk of this condition. Degenerative changes can start after a sudden injury. Growth of bone spurs can compress the nerve roots and cause pain.  SYMPTOMS  The symptoms vary from person to person. Some people may have no pain, while others have severe pain. The pain may be so severe that it can limit your activities. The location of the pain depends on the part of your backbone  that is affected. You will have neck or arm pain if a disk in the neck area is affected. You will have pain in your back, buttocks, or legs if a disk in the lower back is affected. The pain becomes worse while bending, reaching up, or with twisting movements. The pain may start gradually and then get worse as time passes. It may also start after a major or minor injury. You may feel numbness or tingling in the arms or legs.  DIAGNOSIS  Your caregiver will ask you about your symptoms and about activities or habits that may cause the pain. He or she may also ask about any injuries, diseases, or treatments you have had earlier. Your caregiver will examine you to check for the range of movement that is possible in the affected area, to check for strength in your extremities, and to check for sensation in the areas of the arms and legs supplied by different nerve roots. An X-ray of the spine may be taken. Your caregiver may suggest other imaging tests, such as magnetic resonance imaging (MRI), if needed.  TREATMENT  Treatment includes rest, modifying your activities, and applying ice and heat. Your caregiver may prescribe medicines to reduce your pain and may ask you to do some exercises to strengthen your back. In some cases, you may need surgery. You and your caregiver will decide on the treatment that is best for you. HOME CARE INSTRUCTIONS   Follow proper lifting and walking techniques as advised by your  caregiver.  Maintain good posture.  Exercise regularly as advised.  Perform relaxation exercises.  Change your sitting, standing, and sleeping habits as advised. Change positions frequently.  Lose weight as advised.  Stop smoking if you smoke.  Wear supportive footwear. SEEK MEDICAL CARE IF:  Your pain does not go away within 1 to 4 weeks. SEEK IMMEDIATE MEDICAL CARE IF:   Your pain is severe.  You notice weakness in your arms, hands, or legs.  You begin to lose control of your bladder  or bowel movements. MAKE SURE YOU:   Understand these instructions.  Will watch your condition.  Will get help right away if you are not doing well or get worse. Document Released: 08/24/2007 Document Revised: 01/19/2012 Document Reviewed: 02/28/2014 [facility] Patient Information 2015 [place], [place]. This information is not intended to replace advice given to you by your health care provider. Make sure you discuss any questions you have with your health care provider.

## 2014-08-10 NOTE — ED Provider Notes (Signed)
Medical screening examination/treatment/procedure(s) were performed by non-physician practitioner and as supervising physician I was immediately available for consultation/collaboration.  Artemus Romanoff, MD 08/10/14 0857 

## 2014-12-09 ENCOUNTER — Encounter (HOSPITAL_COMMUNITY): Payer: Self-pay | Admitting: Emergency Medicine

## 2014-12-09 ENCOUNTER — Emergency Department (HOSPITAL_COMMUNITY)
Admission: EM | Admit: 2014-12-09 | Discharge: 2014-12-09 | Disposition: A | Discharge status: Home / Self Care | Payer: Medicaid Other | Attending: Emergency Medicine | Admitting: Emergency Medicine

## 2014-12-09 DIAGNOSIS — K029 Dental caries, unspecified: Secondary | ICD-10-CM | POA: Diagnosis not present

## 2014-12-09 DIAGNOSIS — G8929 Other chronic pain: Secondary | ICD-10-CM | POA: Diagnosis not present

## 2014-12-09 DIAGNOSIS — M545 Low back pain: Secondary | ICD-10-CM | POA: Diagnosis present

## 2014-12-09 DIAGNOSIS — Z720 Tobacco use: Secondary | ICD-10-CM | POA: Diagnosis not present

## 2014-12-09 DIAGNOSIS — M549 Dorsalgia, unspecified: Secondary | ICD-10-CM

## 2014-12-09 MED ORDER — CYCLOBENZAPRINE HCL 10 MG PO TABS: 10.0000 mg | ORAL_TABLET | Freq: Two times a day (BID) | ORAL | Status: DC | PRN

## 2014-12-09 MED ORDER — HYDROCODONE-ACETAMINOPHEN 5-325 MG PO TABS: 1.0000 | ORAL_TABLET | ORAL | Status: DC | PRN

## 2014-12-09 MED ORDER — AMOXICILLIN 500 MG PO CAPS: 500.0000 mg | ORAL_CAPSULE | Freq: Three times a day (TID) | ORAL | Status: DC

## 2014-12-09 NOTE — ED Notes (Signed)
Pt reports chronic lower back pain. Pt reports left px of hydrocodone while out of town this weekend. Pt denies any new or recent injury. Pt also reports right sided dental pain for last few days. nad noted.

## 2014-12-09 NOTE — Discharge Instructions (Signed)
Do not take the narcotic or muscle relaxant if driving because they will make you sleepy. Follow up with Dr. Dutch QuintPoole for your pain management.

## 2014-12-09 NOTE — ED Provider Notes (Signed)
CSN: [id number]     Arrival date & time 12/09/14  1005 History   First MD Initiated Contact with Patient 12/09/14 1013     Chief Complaint  Patient presents with  . Back Pain     (Consider location/radiation/quality/duration/timing/severity/associated sxs/prior Treatment) Patient is a 34 y.o. male presenting with back pain. The history is provided by the patient.  Back Pain Location:  Lumbar spine Radiates to:  Does not radiate  Rosita Fireayman R Keech is a 34 y.o. male who presents to the ED with chronic low back pain. He states that he was out of town and left his Rx for hydrocodone in the hotel room. He called back but they told him they did not find it. Patient called his doctor but they can not refill over the phone and told him to come to the ED until he can return to the office. He is scheduled for surgery with Dr. Dutch QuintPoole, neurosurgeon, for a herniated disc.  He denies any new injuries. He also complains of dental pain on the right for the past few days.  Past Medical History  Diagnosis Date  . Herniated lumbar intervertebral disc    History reviewed. No pertinent past surgical history. History reviewed. No pertinent family history. History  Substance Use Topics  . Smoking status: Current Every Day Smoker -- 0.25 packs/day    Types: Cigarettes  . Smokeless tobacco: Not on file  . Alcohol Use: Yes     Comment: occ    Review of Systems  HENT: Positive for dental problem.   Musculoskeletal: Positive for back pain.  all other systems negative    Allergies  Pumpkin flavor and Ibuprofen  Home Medications   Prior to Admission medications   Medication Sig Start Date End Date Taking? Authorizing Provider  acetaminophen (TYLENOL) 500 MG tablet Take 1,000 mg by mouth every 4 (four) hours as needed. pain   Yes Historical Provider, MD  amoxicillin (AMOXIL) 500 MG capsule Take 1 capsule (500 mg total) by mouth 3 (three) times daily. 12/09/14   Yanira Tolsma Orlene OchM Imogine Carvell, NP  cyclobenzaprine  (FLEXERIL) 10 MG tablet Take 1 tablet (10 mg total) by mouth 2 (two) times daily as needed for muscle spasms. 12/09/14   Janee Ureste Orlene OchM Malaya Cagley, NP  HYDROcodone-acetaminophen (NORCO/VICODIN) 5-325 MG per tablet Take 1 tablet by mouth every 4 (four) hours as needed. 12/09/14   Ubah Radke Orlene OchM Sharne Linders, NP   BP 135/89 mmHg  Pulse 71  Temp(Src) 97.9 F (36.6 C) (Oral)  Resp 18  Ht 5\' 7"  (1.702 m)  Wt 147 lb (66.679 kg)  BMI 23.02 kg/m2  SpO2 100% Physical Exam  Constitutional: He is oriented to person, place, and time. He appears well-developed and well-nourished. No distress.  HENT:  Head: Normocephalic and atraumatic.  Mouth/Throat:    Decay and tenderness first molar lower right.   Eyes: EOM are normal. Pupils are equal, round, and reactive to light.  Neck: Normal range of motion. Neck supple.  Cardiovascular: Normal rate and regular rhythm.   Pulmonary/Chest: Effort normal. No respiratory distress. He has no wheezes. He has no rales.  Abdominal: Soft. Bowel sounds are normal. There is no tenderness.  Musculoskeletal: Normal range of motion. He exhibits no edema.       Lumbar back: He exhibits tenderness and spasm. He exhibits no deformity and normal pulse. Decreased range of motion: due to pain.  Neurological: He is alert and oriented to person, place, and time. He has normal strength. No cranial nerve deficit or  sensory deficit. Coordination and gait normal.  Reflex Scores:      Bicep reflexes are 2+ on the right side and 2+ on the left side.      Brachioradialis reflexes are 2+ on the right side and 2+ on the left side.      Patellar reflexes are 2+ on the right side and 2+ on the left side.      Achilles reflexes are 2+ on the right side and 2+ on the left side. Pedal pulses equal, adequate circulation. Ambulatory without foot drag.   Skin: Skin is warm and dry.  Psychiatric: He has a normal mood and affect. His behavior is normal.  Nursing note and vitals reviewed.   ED Course  Procedures    MDM  34 y.o. male with long hx of chronic pain with previous ED visits for same.  Today with request for pain medication after he left his while out of town. Pt endorses his usual long standing chronic pain today, no change from his usual chronic pain pattern.  Pt encouraged to f/u with Dr. Dutch Quint for good continuity of care and control of his chronic pain.  Verb understanding.  Dental pain x 3 days without signs of abscess. Will treat for infection and pain. He will see a dentist as soon as possible.  Final diagnoses:  Chronic back pain  Pain due to dental caries      Janne Napoleon, NP 12/09/14 [address] [place], [place] 12/09/14 1047  Benny Lennert, MD 12/09/14 [phone]

## 2015-03-24 ENCOUNTER — Encounter (HOSPITAL_COMMUNITY): Payer: Self-pay

## 2015-03-24 ENCOUNTER — Emergency Department (HOSPITAL_COMMUNITY)
Admission: EM | Admit: 2015-03-24 | Discharge: 2015-03-24 | Disposition: A | Discharge status: Home / Self Care | Payer: Medicaid Other | Attending: Emergency Medicine | Admitting: Emergency Medicine

## 2015-03-24 DIAGNOSIS — Z720 Tobacco use: Secondary | ICD-10-CM | POA: Diagnosis not present

## 2015-03-24 DIAGNOSIS — M549 Dorsalgia, unspecified: Secondary | ICD-10-CM

## 2015-03-24 DIAGNOSIS — M545 Low back pain: Secondary | ICD-10-CM | POA: Diagnosis not present

## 2015-03-24 DIAGNOSIS — Z760 Encounter for issue of repeat prescription: Secondary | ICD-10-CM | POA: Insufficient documentation

## 2015-03-24 DIAGNOSIS — G8929 Other chronic pain: Secondary | ICD-10-CM | POA: Insufficient documentation

## 2015-03-24 HISTORY — DX: Other chronic pain: G89.29

## 2015-03-24 HISTORY — DX: Dorsalgia, unspecified: M54.9

## 2015-03-24 MED ORDER — DEXAMETHASONE SODIUM PHOSPHATE 4 MG/ML IJ SOLN
8.0000 mg | Freq: Once | INTRAMUSCULAR | Status: AC
Start: 2015-03-24 — End: 2015-03-24
  Administered 2015-03-24: 8 mg via INTRAMUSCULAR
  Filled 2015-03-24: qty 2

## 2015-03-24 MED ORDER — HYDROCODONE-ACETAMINOPHEN 5-325 MG PO TABS: 1.0000 | ORAL_TABLET | Freq: Three times a day (TID) | ORAL | Status: DC

## 2015-03-24 NOTE — Discharge Instructions (Signed)
Please continue your flexeril. Use norco three times daily with food. See your primary MD or neuro MD on Monday or Tuesday for management of your pain. Chronic Back Pain  When back pain lasts longer than 3 months, it is called chronic back pain.People with chronic back pain often go through certain periods that are more intense (flare-ups).  CAUSES Chronic back pain can be caused by wear and tear (degeneration) on different structures in your back. These structures include:  The bones of your spine (vertebrae) and the joints surrounding your spinal cord and nerve roots (facets).  The strong, fibrous tissues that connect your vertebrae (ligaments). Degeneration of these structures may result in pressure on your nerves. This can lead to constant pain. HOME CARE INSTRUCTIONS  Avoid bending, heavy lifting, prolonged sitting, and activities which make the problem worse.  Take brief periods of rest throughout the day to reduce your pain. Lying down or standing usually is better than sitting while you are resting.  Take over-the-counter or prescription medicines only as directed by your caregiver. SEEK IMMEDIATE MEDICAL CARE IF:   You have weakness or numbness in one of your legs or feet.  You have trouble controlling your bladder or bowels.  You have nausea, vomiting, abdominal pain, shortness of breath, or fainting. Document Released: 12/04/2004 Document Revised: 01/19/2012 Document Reviewed: 10/11/2011 [facility] Patient Information 2015 [place], [place]. This information is not intended to replace advice given to you by your health care provider. Make sure you discuss any questions you have with your health care provider.

## 2015-03-24 NOTE — ED Notes (Signed)
Pt reports has herniated disc in lower back and takes hydrocodone and flexeril prn.  Reports ran out of hydrocodone Thursday.

## 2015-03-24 NOTE — ED Provider Notes (Signed)
CSN: [id number]     Arrival date & time 03/24/15  1043 History   First MD Initiated Contact with Patient 03/24/15 1056     Chief Complaint  Patient presents with  . Back Pain     (Consider location/radiation/quality/duration/timing/severity/associated sxs/prior Treatment) HPI Comments: Pt states he has run out of his pain medication. He was told by Neurosurgeon to come to the ED for assistance with his pain.  Patient is a 34 y.o. male presenting with back pain. The history is provided by the patient.  Back Pain Location:  Lumbar spine Quality:  Aching (sharpe) Pain severity:  Moderate Pain is:  Same all the time Onset quality:  Gradual Timing:  Intermittent Progression:  Worsening Chronicity: Acute on chronic. Context comment:  History herniated disc in the lumbar area. When out of hydrocodone. Relieved by:  Nothing Worsened by:  Movement Ineffective treatments:  Lying down Associated symptoms: no bladder incontinence, no bowel incontinence, no numbness and no paresthesias   Risk factors: no recent surgery     Past Medical History  Diagnosis Date  . Herniated lumbar intervertebral disc   . Chronic back pain    History reviewed. No pertinent past surgical history. No family history on file. History  Substance Use Topics  . Smoking status: Current Every Day Smoker -- 0.25 packs/day    Types: Cigarettes  . Smokeless tobacco: Not on file  . Alcohol Use: Yes     Comment: occ    Review of Systems  Gastrointestinal: Negative for bowel incontinence.  Genitourinary: Negative for bladder incontinence.  Musculoskeletal: Positive for back pain.  Neurological: Negative for numbness and paresthesias.  All other systems reviewed and are negative.     Allergies  Pumpkin flavor and Ibuprofen  Home Medications   Prior to Admission medications   Medication Sig Start Date End Date Taking? Authorizing Provider  acetaminophen (TYLENOL) 500 MG tablet Take 1,000 mg by mouth  every 4 (four) hours as needed. pain   Yes Historical Provider, MD  cyclobenzaprine (FLEXERIL) 10 MG tablet Take 1 tablet (10 mg total) by mouth 2 (two) times daily as needed for muscle spasms. 12/09/14  Yes Hope Orlene OchM Neese, NP  HYDROcodone-acetaminophen (NORCO/VICODIN) 5-325 MG per tablet Take 1 tablet by mouth every 4 (four) hours as needed. 12/09/14  Yes Hope Orlene OchM Neese, NP  amoxicillin (AMOXIL) 500 MG capsule Take 1 capsule (500 mg total) by mouth 3 (three) times daily. Patient not taking: Reported on 03/24/2015 12/09/14   Hope Orlene OchM Neese, NP   BP 141/88 mmHg  Pulse 84  Temp(Src) 97.9 F (36.6 C) (Oral)  Resp 18  Ht 5\' 7"  (1.702 m)  Wt 148 lb (67.132 kg)  BMI 23.17 kg/m2  SpO2 100% Physical Exam  Constitutional: He is oriented to person, place, and time. He appears well-developed and well-nourished.  Non-toxic appearance.  HENT:  Head: Normocephalic.  Right Ear: Tympanic membrane and external ear normal.  Left Ear: Tympanic membrane and external ear normal.  Eyes: EOM and lids are normal. Pupils are equal, round, and reactive to light.  Neck: Normal range of motion. Neck supple. Carotid bruit is not present.  Cardiovascular: Normal rate, regular rhythm, normal heart sounds, intact distal pulses and normal pulses.   Pulmonary/Chest: Breath sounds normal. No respiratory distress.  Abdominal: Soft. Bowel sounds are normal. There is no tenderness. There is no guarding.  Musculoskeletal:       Lumbar back: He exhibits decreased range of motion, pain and spasm.  Lymphadenopathy:  Head (right side): No submandibular adenopathy present.       Head (left side): No submandibular adenopathy present.    He has no cervical adenopathy.  Neurological: He is alert and oriented to person, place, and time. He has normal strength. No cranial nerve deficit or sensory deficit.  Skin: Skin is warm and dry.  Psychiatric: He has a normal mood and affect. His speech is normal.  Nursing note and vitals  reviewed.   ED Course  Procedures (including critical care time) Labs Review Labs Reviewed - No data to display  Imaging Review No results found.   EKG Interpretation None      MDM  Vital signs are well within normal limits. Pulse oximetry is 100% on room air. Within normal limits by my interpretation.  Patient has a history of "herniated disc in the lumbar area". He is being treated by neurosurgery for chronic pain. He states that he ran out of his hydrocodone, he called the neurosurgeon on call, and was told to come to the emergency department because there was nothing that he could do for him. No gross neurologic deficit appreciated at this time.  Discussed with the patient the need to monitor his medication closely, and to maintain good contact with his primary doctor, or neurosurgery physician. Prescription for 12 tablets of Norco given to the patient. Patient strongly instructed to see his physician on Monday or Tuesday. Patient has Flexeril, and he will continue this medication.    Final diagnoses:  None    **I have reviewed nursing notes, vital signs, and all appropriate lab and imaging results for this patient.[name]*    Tarah Buboltz, PA-C 03/24/15 1156  Samuel JesterKathleen McManus, DO 03/25/15 [phone]

## 2015-04-22 ENCOUNTER — Emergency Department (HOSPITAL_COMMUNITY)
Admission: EM | Admit: 2015-04-22 | Discharge: 2015-04-22 | Discharge status: Against Medical Advice | Payer: Medicaid Other | Attending: Emergency Medicine | Admitting: Emergency Medicine

## 2015-04-22 ENCOUNTER — Encounter (HOSPITAL_COMMUNITY): Payer: Self-pay | Admitting: Emergency Medicine

## 2015-04-22 DIAGNOSIS — R110 Nausea: Secondary | ICD-10-CM | POA: Insufficient documentation

## 2015-04-22 DIAGNOSIS — Z720 Tobacco use: Secondary | ICD-10-CM | POA: Diagnosis not present

## 2015-04-22 DIAGNOSIS — G8929 Other chronic pain: Secondary | ICD-10-CM | POA: Insufficient documentation

## 2015-04-22 DIAGNOSIS — R6883 Chills (without fever): Secondary | ICD-10-CM | POA: Diagnosis not present

## 2015-04-22 LAB — COMPREHENSIVE METABOLIC PANEL
ALBUMIN: 4.4 g/dL (ref 3.5–5.0)
ALK PHOS: 80 U/L (ref 38–126)
ALT: 13 U/L — ABNORMAL LOW (ref 17–63)
ANION GAP: 11 (ref 5–15)
AST: 18 U/L (ref 15–41)
BUN: 15 mg/dL (ref 6–20)
CALCIUM: 9.5 mg/dL (ref 8.9–10.3)
CO2: 24 mmol/L (ref 22–32)
Chloride: 104 mmol/L (ref 101–111)
Creatinine, Ser: 0.98 mg/dL (ref 0.61–1.24)
GFR calc Af Amer: >60 mL/min (ref >60)
GFR calc non Af Amer: >60 mL/min (ref >60)
GLUCOSE: 114 mg/dL — AB (ref 65–99)
Potassium: 3.8 mmol/L (ref 3.5–5.1)
Sodium: 139 mmol/L (ref 135–145)
Total Bilirubin: 0.5 mg/dL (ref 0.3–1.2)
Total Protein: 7.7 g/dL (ref 6.5–8.1)

## 2015-04-22 LAB — CBC WITH DIFFERENTIAL/PLATELET
Basophils Absolute: 0 10*3/uL (ref 0.0–0.1)
Basophils Relative: 0 % (ref 0–1)
EOS ABS: 0.2 10*3/uL (ref 0.0–0.7)
Eosinophils Relative: 2 % (ref 0–5)
HEMATOCRIT: 42.7 % (ref 39.0–52.0)
Hemoglobin: 14.2 g/dL (ref 13.0–17.0)
LYMPHS ABS: 3 10*3/uL (ref 0.7–4.0)
Lymphocytes Relative: 40 % (ref 12–46)
MCH: 23.9 pg — AB (ref 26.0–34.0)
MCHC: 33.3 g/dL (ref 30.0–36.0)
MCV: 71.8 fL — AB (ref 78.0–100.0)
MONOS PCT: 8 % (ref 3–12)
Monocytes Absolute: 0.6 10*3/uL (ref 0.1–1.0)
NEUTROS ABS: 3.8 10*3/uL (ref 1.7–7.7)
Neutrophils Relative %: 50 % (ref 43–77)
Platelets: 322 10*3/uL (ref 150–400)
RBC: 5.95 MIL/uL — ABNORMAL HIGH (ref 4.22–5.81)
RDW: 15.8 % — AB (ref 11.5–15.5)
WBC: 7.5 10*3/uL (ref 4.0–10.5)

## 2015-04-22 MED ORDER — ONDANSETRON 8 MG PO TBDP
8.0000 mg | ORAL_TABLET | Freq: Once | ORAL | Status: AC
  Administered 2015-04-22: 8 mg via ORAL
  Filled 2015-04-22: qty 1

## 2015-04-22 NOTE — ED Notes (Signed)
PT told registration he was leaving at this time with his daughter.

## 2015-04-22 NOTE — ED Notes (Signed)
PT reports waking up around 0430 this am with hot/cold chills and then woke up around 0730 with extreme nausea but denies any vomiting. PT reports BM x3 this am. PT reports generalized body aches.

## 2016-01-23 ENCOUNTER — Encounter (HOSPITAL_COMMUNITY): Payer: Self-pay | Admitting: *Deleted

## 2016-01-23 ENCOUNTER — Emergency Department (HOSPITAL_COMMUNITY)
Admission: EM | Admit: 2016-01-23 | Discharge: 2016-01-23 | Disposition: A | Discharge status: Home / Self Care | Payer: Medicaid Other | Attending: Emergency Medicine | Admitting: Emergency Medicine

## 2016-01-23 DIAGNOSIS — Z792 Long term (current) use of antibiotics: Secondary | ICD-10-CM | POA: Insufficient documentation

## 2016-01-23 DIAGNOSIS — F17210 Nicotine dependence, cigarettes, uncomplicated: Secondary | ICD-10-CM | POA: Insufficient documentation

## 2016-01-23 DIAGNOSIS — R1110 Vomiting, unspecified: Secondary | ICD-10-CM

## 2016-01-23 DIAGNOSIS — R197 Diarrhea, unspecified: Secondary | ICD-10-CM | POA: Insufficient documentation

## 2016-01-23 LAB — COMPREHENSIVE METABOLIC PANEL
ALT: 18 U/L (ref 17–63)
ANION GAP: 8 (ref 5–15)
AST: 26 U/L (ref 15–41)
Albumin: 4.8 g/dL (ref 3.5–5.0)
Alkaline Phosphatase: 70 U/L (ref 38–126)
BUN: 14 mg/dL (ref 6–20)
CHLORIDE: 106 mmol/L (ref 101–111)
CO2: 26 mmol/L (ref 22–32)
Calcium: 9.4 mg/dL (ref 8.9–10.3)
Creatinine, Ser: 0.9 mg/dL (ref 0.61–1.24)
Glucose, Bld: 84 mg/dL (ref 65–99)
POTASSIUM: 3.9 mmol/L (ref 3.5–5.1)
SODIUM: 140 mmol/L (ref 135–145)
Total Bilirubin: 0.8 mg/dL (ref 0.3–1.2)
Total Protein: 8.6 g/dL — ABNORMAL HIGH (ref 6.5–8.1)

## 2016-01-23 LAB — CBC WITH DIFFERENTIAL/PLATELET
Basophils Absolute: 0 10*3/uL (ref 0.0–0.1)
Basophils Relative: 1 %
EOS ABS: 0.2 10*3/uL (ref 0.0–0.7)
Eosinophils Relative: 3 %
HCT: 45.9 % (ref 39.0–52.0)
Hemoglobin: 15.4 g/dL (ref 13.0–17.0)
LYMPHS ABS: 2.4 10*3/uL (ref 0.7–4.0)
LYMPHS PCT: 35 %
MCH: 23.7 pg — AB (ref 26.0–34.0)
MCHC: 33.6 g/dL (ref 30.0–36.0)
MCV: 70.5 fL — AB (ref 78.0–100.0)
MONO ABS: 0.5 10*3/uL (ref 0.1–1.0)
Monocytes Relative: 7 %
Neutro Abs: 3.9 10*3/uL (ref 1.7–7.7)
Neutrophils Relative %: 56 %
PLATELETS: 272 10*3/uL (ref 150–400)
RBC: 6.51 MIL/uL — ABNORMAL HIGH (ref 4.22–5.81)
RDW: 16 % — AB (ref 11.5–15.5)
WBC: 7 10*3/uL (ref 4.0–10.5)

## 2016-01-23 MED ORDER — ONDANSETRON 4 MG PO TBDP: ORAL_TABLET | ORAL | Status: DC

## 2016-01-23 MED ORDER — KETOROLAC TROMETHAMINE 30 MG/ML IJ SOLN: 30.0000 mg | Freq: Once | INTRAMUSCULAR | Status: DC

## 2016-01-23 MED ORDER — ONDANSETRON HCL 4 MG/2ML IJ SOLN
4.0000 mg | Freq: Once | INTRAMUSCULAR | Status: AC
  Administered 2016-01-23: 4 mg via INTRAVENOUS

## 2016-01-23 MED ORDER — DICYCLOMINE HCL 20 MG PO TABS: ORAL_TABLET | ORAL | Status: DC

## 2016-01-23 MED ORDER — SODIUM CHLORIDE 0.9 % IV BOLUS (SEPSIS)
1000.0000 mL | Freq: Once | INTRAVENOUS | Status: AC
  Administered 2016-01-23: 1000 mL via INTRAVENOUS

## 2016-01-23 NOTE — ED Notes (Signed)
Pt came into ED today c/o fever (didn't check temp, but felt sweaty) upon wakening at 0400 and emesis. Vomiting x 3 this morning since 0400. Pt also c/o mid abdominal cramping but no diarrhea. Pain 6/10 in abdomen.

## 2016-01-23 NOTE — ED Provider Notes (Signed)
CSN: [id number]     Arrival date & time 01/23/16  0707 History   First MD Initiated Contact with Patient 01/23/16 [phone]     Chief Complaint  Patient presents with  . Emesis     (Consider location/radiation/quality/duration/timing/severity/associated sxs/prior Treatment) Patient is a 35 y.o. male presenting with vomiting. The history is provided by the patient (Patient complains of vomiting and diarrhea for 24 hours).  Emesis Severity:  Moderate Timing:  Constant Quality:  Bilious material Able to tolerate:  Liquids Progression:  Unchanged Chronicity:  New Recent urination:  Decreased Associated symptoms: diarrhea   Associated symptoms: no abdominal pain and no headaches     Past Medical History  Diagnosis Date  . Herniated lumbar intervertebral disc   . Chronic back pain    History reviewed. No pertinent past surgical history. No family history on file. Social History  Substance Use Topics  . Smoking status: Current Every Day Smoker -- 0.50 packs/day for 23 years    Types: Cigarettes  . Smokeless tobacco: None  . Alcohol Use: Yes     Comment: only dranks on weekends    Review of Systems  Constitutional: Negative for appetite change and fatigue.  HENT: Negative for congestion, ear discharge and sinus pressure.   Eyes: Negative for discharge.  Respiratory: Negative for cough.   Cardiovascular: Negative for chest pain.  Gastrointestinal: Positive for vomiting and diarrhea. Negative for abdominal pain.  Genitourinary: Negative for frequency and hematuria.  Musculoskeletal: Negative for back pain.  Skin: Negative for rash.  Neurological: Negative for seizures and headaches.  Psychiatric/Behavioral: Negative for hallucinations.      Allergies  Pumpkin flavor and Ibuprofen  Home Medications   Prior to Admission medications   Medication Sig Start Date End Date Taking? Authorizing Provider  acetaminophen (TYLENOL) 500 MG tablet Take 1,000 mg by mouth every 4 (four)  hours as needed. pain    Historical Provider, MD  amoxicillin (AMOXIL) 500 MG capsule Take 1 capsule (500 mg total) by mouth 3 (three) times daily. Patient not taking: Reported on 03/24/2015 12/09/14   Janne Napoleon, NP  cyclobenzaprine (FLEXERIL) 10 MG tablet Take 1 tablet (10 mg total) by mouth 2 (two) times daily as needed for muscle spasms. 12/09/14   Hope Orlene Och, NP  dicyclomine (BENTYL) 20 MG tablet Take one every 6 hours for abd cramps 01/23/16   Bethann Berkshire, MD  HYDROcodone-acetaminophen (NORCO/VICODIN) 5-325 MG per tablet Take 1 tablet by mouth 3 (three) times daily. 03/24/15   [name], PA-C  ondansetron (ZOFRAN ODT) 4 MG disintegrating tablet  ODT q4 hours prn nausea/vomit 01/23/16   Bethann Berkshire, MD   BP 129/74 mmHg  Pulse 68  Temp(Src) 97.7 F (36.5 C) (Oral)  Resp 16  Ht  (1.702 m)  Wt 151 lb (68.493 kg)  BMI 23.64 kg/m2  SpO2 99% Physical Exam  Constitutional: He is oriented to person, place, and time. He appears well-developed.  HENT:  Head: Normocephalic.  Eyes: Conjunctivae and EOM are normal. No scleral icterus.  Neck: Neck supple. No thyromegaly present.  Cardiovascular: Normal rate and regular rhythm.  Exam reveals no gallop and no friction rub.   No murmur heard. Pulmonary/Chest: No stridor. He has no wheezes. He has no rales. He exhibits no tenderness.  Abdominal: He exhibits no distension. There is tenderness. There is no rebound.  Mild abdominal tenderness  Musculoskeletal: Normal range of motion. He exhibits no edema.  Lymphadenopathy:    He has no cervical adenopathy.  Neurological: He is oriented to person, place, and time. He exhibits normal muscle tone. Coordination normal.  Skin: No rash noted. No erythema.  Psychiatric: He has a normal mood and affect. His behavior is normal.    ED Course  Procedures (including critical care time) Labs Review Labs Reviewed  CBC WITH DIFFERENTIAL/PLATELET - Abnormal; Notable for the following:    RBC  6.51 (*)    MCV 70.5 (*)    MCH 23.7 (*)    RDW 16.0 (*)    All other components within normal limits  COMPREHENSIVE METABOLIC PANEL - Abnormal; Notable for the following:    Total Protein 8.6 (*)    All other components within normal limits    Imaging Review No results found. I have personally reviewed and evaluated these images and lab results as part of my medical decision-making.   EKG Interpretation None      MDM   Final diagnoses:  Vomiting and diarrhea    Patient with gastroenteritis dehydration. Patient improved with IV fluids and Zofran. Patient will be discharged with Bentyl and Zofran and will follow-up as needed   Bethann BerkshireJoseph Filippa Yarbough, MD 01/23/16 [phone]

## 2016-01-23 NOTE — Discharge Instructions (Signed)
Follow-up with your doctor or [facility] and to 3 weeks for repeat CBC

## 2016-04-20 ENCOUNTER — Emergency Department (HOSPITAL_COMMUNITY)
Admission: EM | Admit: 2016-04-20 | Discharge: 2016-04-20 | Disposition: A | Discharge status: Home / Self Care | Payer: Medicaid Other | Attending: Emergency Medicine | Admitting: Emergency Medicine

## 2016-04-20 ENCOUNTER — Encounter (HOSPITAL_COMMUNITY): Payer: Self-pay | Admitting: *Deleted

## 2016-04-20 DIAGNOSIS — M545 Low back pain: Secondary | ICD-10-CM | POA: Insufficient documentation

## 2016-04-20 DIAGNOSIS — F17210 Nicotine dependence, cigarettes, uncomplicated: Secondary | ICD-10-CM | POA: Insufficient documentation

## 2016-04-20 MED ORDER — CYCLOBENZAPRINE HCL 10 MG PO TABS: 10.0000 mg | ORAL_TABLET | Freq: Two times a day (BID) | ORAL | Status: DC | PRN

## 2016-04-20 MED ORDER — PREDNISONE 10 MG PO TABS: ORAL_TABLET | ORAL | Status: DC

## 2016-04-20 NOTE — ED Notes (Signed)
Pt complains of chronic lower back pain.  Worsening x 3 weeks. Radiates down bilateral legs. Pt ambulatory with NAD noted.

## 2016-04-20 NOTE — ED Provider Notes (Signed)
CSN: [id number]     Arrival date & time 04/20/16  1242 History  By signing my name below, I, Troy Ramos, attest that this documentation has been prepared under the direction and in the presence of Langston Masker, [place]. Electronically Signed: Ronney Ramos, ED Scribe. 04/20/2016. 1:43 PM.     Chief Complaint  Patient presents with  . Back Pain   The history is provided by the patient and medical records. No language interpreter was used.   HPI Comments: PRATIK DALZIEL is a 35 y.o. male with a history of herniated lumbar intervertebral disc and chronic back pain, who presents to the Emergency Department complaining of constant, worsening, acute on chronic exacerbation of lower back pain radiating down his bilateral legs that has been worsening over the last 2 weeks. No associated symptoms were noted. He states he has had chronic back pain due to a herniated disc at L1-S4, following an injury that occurred 1 year ago. Patient reports Dr. Dutch Quint at [facility] in [place], [place], follows his back pain, and patient is scheduled to follow up with Dr. Dutch Quint in several months. He reports he has been recommended surgery but is awaiting insurance approval before proceeding. Patient denies any recent trauma, injury, or known cause of his exacerbation in the past few weeks. Sitting or standing exacerbates his pain. He states Dr. Ollen Bowl, also at [facility], manages his chronic pain. He states he has taken a muscle relaxant (Flexeril, per medical records), which is effective for several days at a time.  Past Medical History  Diagnosis Date  . Herniated lumbar intervertebral disc   . Chronic back pain    History reviewed. No pertinent past surgical history. History reviewed. No pertinent family history. Social History  Substance Use Topics  . Smoking status: Current Every Day Smoker -- 0.50 packs/day for 23 years    Types: Cigarettes  . Smokeless tobacco: None  . Alcohol Use: Yes      Comment: only dranks on weekends    Review of Systems  Musculoskeletal: Positive for back pain.  Neurological: Negative for weakness and numbness.   Allergies  Pumpkin flavor and Ibuprofen  Home Medications   Prior to Admission medications   Medication Sig Start Date End Date Taking? Authorizing Provider  acetaminophen (TYLENOL) 500 MG tablet Take 1,000 mg by mouth every 4 (four) hours as needed. pain    Historical Provider, MD  amoxicillin (AMOXIL) 500 MG capsule Take 1 capsule (500 mg total) by mouth 3 (three) times daily. Patient not taking: Reported on 03/24/2015 12/09/14   Janne Napoleon, NP  cyclobenzaprine (FLEXERIL) 10 MG tablet Take 1 tablet (10 mg total) by mouth 2 (two) times daily as needed for muscle spasms. 12/09/14   Hope Orlene Och, NP  dicyclomine (BENTYL) 20 MG tablet Take one every 6 hours for abd cramps 01/23/16   Bethann Berkshire, MD  HYDROcodone-acetaminophen (NORCO/VICODIN) 5-325 MG per tablet Take 1 tablet by mouth 3 (three) times daily. 03/24/15   [name], PA-C  ondansetron (ZOFRAN ODT) 4 MG disintegrating tablet  ODT q4 hours prn nausea/vomit 01/23/16   Bethann Berkshire, MD    Physical Exam  Constitutional: He is oriented to person, place, and time. He appears well-developed and well-nourished. No distress.  HENT:  Head: Normocephalic and atraumatic.  Eyes: Conjunctivae and EOM are normal.  Neck: Neck supple. No tracheal deviation present.  Cardiovascular: Normal rate.   Pulmonary/Chest: Effort normal. No respiratory distress.  Musculoskeletal: Normal range of motion. He exhibits  tenderness.  Diffusely tender lumbar spine.   Neurological: He is alert and oriented to person, place, and time.  Skin: Skin is warm and dry.  Psychiatric: He has a normal mood and affect. His behavior is normal.  Nursing note and vitals reviewed.   ED Course  Procedures (including critical care time)  DIAGNOSTIC STUDIES: Oxygen Saturation is 100% on RA, normal by my  interpretation.    COORDINATION OF CARE: 1:20 PM - Discussed treatment plan with pt at bedside which includes steroids and a muscle relaxant. Pt verbalized understanding and agreed to plan.   MDM   Final diagnoses:  Low back pain without sciatica, unspecified back pain laterality    Patient with back pain.  No neurological deficits and normal neuro exam.  Patient can walk but states is painful.  No loss of bowel or bladder control.  No concern for cauda equina.  No fever, night sweats, weight loss, h/o cancer, IVDU.  RICE protocol and pain medicine indicated and discussed with patient.    An After Visit Summary was printed and given to the patient. Meds ordered this encounter  Medications  . cyclobenzaprine (FLEXERIL) 10 MG tablet    Sig: Take 1 tablet (10 mg total) by mouth 2 (two) times daily as needed for muscle spasms.    Dispense:  20 tablet    Refill:  0    Order Specific Question:  Supervising Provider    Answer:  MILLER, BRIAN [[id number]]  . predniSONE (DELTASONE) 10 MG tablet    Sig: 6,5,4,3,2,1 taper    Dispense:  21 tablet    Refill:  0    Order Specific Question:  Supervising Provider    Answer:  Eber HongMILLER, BRIAN [[id number]]    Lonia SkinnerLeslie K CottondaleSofia, PA-C 04/20/16 1403  Marily MemosJason Mesner, MD 04/20/16 1523

## 2016-04-20 NOTE — Discharge Instructions (Signed)

## 2016-12-04 ENCOUNTER — Encounter (HOSPITAL_BASED_OUTPATIENT_CLINIC_OR_DEPARTMENT_OTHER): Payer: Self-pay | Admitting: *Deleted

## 2016-12-04 ENCOUNTER — Emergency Department (HOSPITAL_BASED_OUTPATIENT_CLINIC_OR_DEPARTMENT_OTHER)
Admission: EM | Admit: 2016-12-04 | Discharge: 2016-12-05 | Disposition: A | Discharge status: Home / Self Care | Payer: Medicaid Other | Attending: Emergency Medicine | Admitting: Emergency Medicine

## 2016-12-04 DIAGNOSIS — R52 Pain, unspecified: Secondary | ICD-10-CM

## 2016-12-04 DIAGNOSIS — F17210 Nicotine dependence, cigarettes, uncomplicated: Secondary | ICD-10-CM | POA: Insufficient documentation

## 2016-12-04 DIAGNOSIS — R109 Unspecified abdominal pain: Secondary | ICD-10-CM

## 2016-12-04 DIAGNOSIS — N390 Urinary tract infection, site not specified: Secondary | ICD-10-CM | POA: Insufficient documentation

## 2016-12-04 DIAGNOSIS — R8281 Pyuria: Secondary | ICD-10-CM

## 2016-12-04 LAB — URINALYSIS, MICROSCOPIC (REFLEX)

## 2016-12-04 LAB — URINALYSIS, ROUTINE W REFLEX MICROSCOPIC
BILIRUBIN URINE: NEGATIVE
Glucose, UA: NEGATIVE mg/dL
HGB URINE DIPSTICK: NEGATIVE
Ketones, ur: NEGATIVE mg/dL
Nitrite: NEGATIVE
Protein, ur: NEGATIVE mg/dL
Specific Gravity, Urine: 1.024 (ref 1.005–1.030)
pH: 5.5 (ref 5.0–8.0)

## 2016-12-04 NOTE — ED Triage Notes (Signed)
Abdominal pain and left flank x 2 days.

## 2016-12-05 ENCOUNTER — Emergency Department (HOSPITAL_BASED_OUTPATIENT_CLINIC_OR_DEPARTMENT_OTHER): Payer: Medicaid Other

## 2016-12-05 LAB — CBC WITH DIFFERENTIAL/PLATELET
Basophils Absolute: 0 10*3/uL (ref 0.0–0.1)
Basophils Relative: 0 %
Eosinophils Absolute: 0.1 10*3/uL (ref 0.0–0.7)
Eosinophils Relative: 1 %
HCT: 41.5 % (ref 39.0–52.0)
Hemoglobin: 14 g/dL (ref 13.0–17.0)
LYMPHS PCT: 28 %
Lymphs Abs: 2.1 10*3/uL (ref 0.7–4.0)
MCH: 23.8 pg — ABNORMAL LOW (ref 26.0–34.0)
MCHC: 33.7 g/dL (ref 30.0–36.0)
MCV: 70.6 fL — AB (ref 78.0–100.0)
MONO ABS: 0.5 10*3/uL (ref 0.1–1.0)
Monocytes Relative: 8 %
Neutro Abs: 4.5 10*3/uL (ref 1.7–7.7)
Neutrophils Relative %: 63 %
PLATELETS: 365 10*3/uL (ref 150–400)
RBC: 5.88 MIL/uL — ABNORMAL HIGH (ref 4.22–5.81)
RDW: 17.6 % — ABNORMAL HIGH (ref 11.5–15.5)
WBC: 7.2 10*3/uL (ref 4.0–10.5)

## 2016-12-05 LAB — BASIC METABOLIC PANEL
Anion gap: 7 (ref 5–15)
BUN: 13 mg/dL (ref 6–20)
CO2: 29 mmol/L (ref 22–32)
CREATININE: 0.96 mg/dL (ref 0.61–1.24)
Calcium: 9.4 mg/dL (ref 8.9–10.3)
Chloride: 103 mmol/L (ref 101–111)
GFR calc Af Amer: >60 mL/min (ref >60)
GLUCOSE: 99 mg/dL (ref 65–99)
Potassium: 3.7 mmol/L (ref 3.5–5.1)
Sodium: 139 mmol/L (ref 135–145)

## 2016-12-05 MED ORDER — CIPROFLOXACIN HCL 500 MG PO TABS: 500.0000 mg | ORAL_TABLET | Freq: Two times a day (BID) | ORAL | 0 refills | Status: DC

## 2016-12-05 MED ORDER — IOPAMIDOL (ISOVUE-300) INJECTION 61%
100.0000 mL | Freq: Once | INTRAVENOUS | Status: AC | PRN
  Administered 2016-12-05: 100 mL via INTRAVENOUS

## 2016-12-05 MED ORDER — CEFTRIAXONE SODIUM 1 G IJ SOLR
1.0000 g | Freq: Once | INTRAMUSCULAR | Status: AC
  Administered 2016-12-05: 1 g via INTRAVENOUS
  Filled 2016-12-05: qty 10

## 2016-12-05 MED ORDER — SODIUM CHLORIDE 0.9 % IV SOLN: Freq: Once | INTRAVENOUS | Status: DC

## 2016-12-05 MED ORDER — OXYCODONE-ACETAMINOPHEN 5-325 MG PO TABS: 1.0000 | ORAL_TABLET | Freq: Four times a day (QID) | ORAL | 0 refills | Status: DC | PRN

## 2016-12-05 MED FILL — CIPROFLOXACIN HCL 500 MG TA: 500 | 7 days supply | Qty: 14 | Fill #0

## 2016-12-05 MED FILL — OXYCODONE W/APAP 5/325 TAB: 5-325 | 3 days supply | Qty: 20 | Fill #0

## 2016-12-05 NOTE — ED Notes (Signed)
Patient transported to CT 

## 2016-12-05 NOTE — ED Notes (Addendum)
Pt c/o mid left back pain and generalized core pain for the last two days.  He denies n/v/d, denies fever.  He states the pain is much worse with movement.

## 2016-12-05 NOTE — ED Provider Notes (Signed)
[facility] Provider Note: Lowella DellJ. Lane Jadyn Brasher, MD, FACEP  CSN: [id number] MRN: [id number] ARRIVAL: 12/04/16 at 2131 ROOM: MH07/MH07   CHIEF COMPLAINT  Abdominal Pain   HISTORY OF PRESENT ILLNESS  Troy Ramos is a 36 y.o. male with a 2 day history of left flank pain radiating to his left suprapubic region. He describes the pain as moderate to severe and worse with movement. He denies associated fever, chills, nausea, vomiting, diarrhea, dysuria, hematuria or urethral discharge.  [place] reveals the patient has received a total of 5 hydrocodone prescriptions in the past year in January through March 2017. These were prescribed by Dr. Jordan LikesPool of neurosurgery.    Past Medical History:  Diagnosis Date  . Chronic back pain   . Herniated lumbar intervertebral disc     History reviewed. No pertinent surgical history.  No family history on file.  Social History  Substance Use Topics  . Smoking status: Current Every Day Smoker    Packs/day: 0.50    Years: 23.00    Types: Cigarettes  . Smokeless tobacco: Never Used  . Alcohol use Yes     Comment: only dranks on weekends    Prior to Admission medications   Not on File    Allergies Pumpkin flavor and Ibuprofen   REVIEW OF SYSTEMS  Negative except as noted here or in the History of Present Illness.   PHYSICAL EXAMINATION  Initial Vital Signs Blood pressure 106/75, pulse 73, temperature 97.8 F (36.6 C), temperature source Oral, resp. rate 20, height 5\' 7"  (1.702 m), weight 149 lb (67.6 kg), SpO2 100 %.  Examination General: Well-developed, well-nourished male in no acute distress; appearance consistent with age of record HENT: normocephalic; atraumatic Eyes: pupils equal, round and reactive to light; extraocular muscles intact Neck: supple Heart: regular rate and rhythm Lungs: clear to auscultation bilaterally Abdomen: soft; nondistended;  mild left suprapubic tenderness; no masses or hepatosplenomegaly; bowel sounds present GU: Mild left CVA tenderness Extremities: No deformity; full range of motion; pulses normal Neurologic: Awake, alert and oriented; motor function intact in all extremities and symmetric; no facial droop Skin: Warm and dry Psychiatric: Flat affect   RESULTS  Summary of this visit's results, reviewed by myself:   EKG Interpretation  Date/Time:    Ventricular Rate:    PR Interval:    QRS Duration:   QT Interval:    QTC Calculation:   R Axis:     Text Interpretation:        Laboratory Studies: Results for orders placed or performed during the hospital encounter of 12/04/16 (from the past 24 hour(s))  Urinalysis, Routine w reflex microscopic     Status: Abnormal   Collection Time: 12/04/16 10:23 PM  Result Value Ref Range   Color, Urine YELLOW YELLOW   APPearance CLEAR CLEAR   Specific Gravity, Urine 1.024 1.005 - 1.030   pH 5.5 5.0 - 8.0   Glucose, UA NEGATIVE NEGATIVE mg/dL   Hgb urine dipstick NEGATIVE NEGATIVE   Bilirubin Urine NEGATIVE NEGATIVE   Ketones, ur NEGATIVE NEGATIVE mg/dL   Protein, ur NEGATIVE NEGATIVE mg/dL   Nitrite NEGATIVE NEGATIVE   Leukocytes, UA MODERATE (A) NEGATIVE  Urinalysis, Microscopic (reflex)     Status: Abnormal   Collection Time: 12/04/16 10:23 PM  Result Value Ref Range   RBC / HPF 0-5 0 - 5 RBC/hpf   WBC, UA TOO NUMEROUS TO COUNT 0 - 5 WBC/hpf   Bacteria,  UA MANY (A) NONE SEEN   Squamous Epithelial / LPF 0-5 (A) NONE SEEN   Mucous PRESENT   CBC with Differential/Platelet     Status: Abnormal   Collection Time: 12/05/16  1:17 AM  Result Value Ref Range   WBC 7.2 4.0 - 10.5 K/uL   RBC 5.88 (H) 4.22 - 5.81 MIL/uL   Hemoglobin 14.0 13.0 - 17.0 g/dL   HCT 96.0 45.4 - 09.8 %   MCV 70.6 (L) 78.0 - 100.0 fL   MCH 23.8 (L) 26.0 - 34.0 pg   MCHC 33.7 30.0 - 36.0 g/dL   RDW 11.9 (H) 14.7 - 82.9 %   Platelets 365 150 - 400 K/uL   Neutrophils Relative %  63 %   Neutro Abs 4.5 1.7 - 7.7 K/uL   Lymphocytes Relative 28 %   Lymphs Abs 2.1 0.7 - 4.0 K/uL   Monocytes Relative 8 %   Monocytes Absolute 0.5 0.1 - 1.0 K/uL   Eosinophils Relative 1 %   Eosinophils Absolute 0.1 0.0 - 0.7 K/uL   Basophils Relative 0 %   Basophils Absolute 0.0 0.0 - 0.1 K/uL  Basic metabolic panel     Status: None   Collection Time: 12/05/16  1:17 AM  Result Value Ref Range   Sodium 139 135 - 145 mmol/L   Potassium 3.7 3.5 - 5.1 mmol/L   Chloride 103 101 - 111 mmol/L   CO2 29 22 - 32 mmol/L   Glucose, Bld 99 65 - 99 mg/dL   BUN 13 6 - 20 mg/dL   Creatinine, Ser 5.62 0.61 - 1.24 mg/dL   Calcium 9.4 8.9 - 13.0 mg/dL   GFR calc non Af Amer >60 >60 mL/min   GFR calc Af Amer >60 >60 mL/min   Anion gap 7 5 - 15   Imaging Studies: Ct Abdomen Pelvis W Wo Contrast  Result Date: 12/05/2016 CLINICAL DATA:  36 year old male with left flank pain. Concern for kidney stone versus pyelonephritis. EXAM: CT ABDOMEN AND PELVIS WITHOUT AND WITH CONTRAST TECHNIQUE: Multidetector CT imaging of the abdomen and pelvis was performed following the standard protocol before and following the bolus administration of intravenous contrast. CONTRAST:  ISOVUE-300 IOPAMIDOL (ISOVUE-300) INJECTION 61% COMPARISON:  None. FINDINGS: Lower chest: The visualized lung bases are clear. No intra-abdominal free air or free fluid. Hepatobiliary: No focal liver abnormality is seen. No gallstones, gallbladder wall thickening, or biliary dilatation. Pancreas: Unremarkable. No pancreatic ductal dilatation or surrounding inflammatory changes. Spleen: Normal in size without focal abnormality. Adrenals/Urinary Tract: The adrenal glands, kidneys, visualized ureters, and urinary bladder appear unremarkable. There is symmetric uptake and excretion of contrast by the kidneys. Stomach/Bowel: Stomach is within normal limits. Appendix appears normal. No evidence of bowel wall thickening, distention, or inflammatory  changes. Vascular/Lymphatic: The abdominal aorta and IVC appear unremarkable on this noncontrast study. No portal venous gas identified. There is no adenopathy. Reproductive: The prostate and seminal vesicles are grossly unremarkable. Other: There is diastases of anterior abdominal wall musculature at the level of the umbilicus. Musculoskeletal: There is sclerotic changes of the sacroiliac joints bilaterally compatible with chronic bilateral sacroiliitis. No acute osseous pathology identified. IMPRESSION: No acute intra-abdominopelvic pathology. No hydronephrosis or nephrolithiasis or evidence of pyelonephritis. Electronically Signed   By: Elgie Collard M.D.   On: 12/05/2016 02:33    ED COURSE  Nursing notes and initial vitals signs, including pulse oximetry, reviewed.  Vitals:   12/04/16 2153 12/04/16 2155 12/05/16 0121  BP:  106/75 95/64  Pulse:  73 64  Resp:  20 16  Temp:  97.8 F (36.6 C)   TempSrc:  Oral   SpO2:  100% 100%  Weight: 149 lb (67.6 kg)    Height: 5\' 7"  (1.702 m)     2:54 AM Patient advised of unremarkable CT findings in light of gross pyuria. We will treat him for possible early pyelonephritis. He was advised that cultures are pending and will not be available on this visit. We will refer to urology if symptoms persist.  PROCEDURES    ED DIAGNOSES     ICD-9-CM ICD-10-CM   1. Acute left flank pain 789.09 R10.9    338.19    2. Pain 780.96 R52 CT ABDOMEN PELVIS W WO CONTRAST     CT ABDOMEN PELVIS W WO CONTRAST  3. Pyuria 791.9 N39.0        Paula Libra, MD 12/05/16 [phone]

## 2016-12-05 NOTE — ED Notes (Signed)
Pt verbalizes understanding of d/c instructions and denies any further needs at this time. 

## 2016-12-06 LAB — URINE CULTURE: CULTURE: NO GROWTH

## 2016-12-09 LAB — GC/CHLAMYDIA PROBE AMP (~~LOC~~) NOT AT ARMC
Chlamydia: NEGATIVE
Neisseria Gonorrhea: NEGATIVE

## 2017-05-20 ENCOUNTER — Emergency Department (HOSPITAL_COMMUNITY): Payer: Self-pay

## 2017-05-20 ENCOUNTER — Emergency Department (HOSPITAL_COMMUNITY)
Admission: EM | Admit: 2017-05-20 | Discharge: 2017-05-20 | Disposition: A | Discharge status: Home / Self Care | Payer: Self-pay | Attending: Physician Assistant | Admitting: Physician Assistant

## 2017-05-20 ENCOUNTER — Encounter (HOSPITAL_COMMUNITY): Payer: Self-pay | Admitting: Emergency Medicine

## 2017-05-20 DIAGNOSIS — Z79899 Other long term (current) drug therapy: Secondary | ICD-10-CM | POA: Insufficient documentation

## 2017-05-20 DIAGNOSIS — K219 Gastro-esophageal reflux disease without esophagitis: Secondary | ICD-10-CM | POA: Insufficient documentation

## 2017-05-20 DIAGNOSIS — R52 Pain, unspecified: Secondary | ICD-10-CM | POA: Insufficient documentation

## 2017-05-20 DIAGNOSIS — F17210 Nicotine dependence, cigarettes, uncomplicated: Secondary | ICD-10-CM | POA: Insufficient documentation

## 2017-05-20 LAB — LIPASE, BLOOD: LIPASE: 29 U/L (ref 11–51)

## 2017-05-20 LAB — CBC
HEMATOCRIT: 44 % (ref 39.0–52.0)
Hemoglobin: 14.4 g/dL (ref 13.0–17.0)
MCH: 22.9 pg — AB (ref 26.0–34.0)
MCHC: 32.7 g/dL (ref 30.0–36.0)
MCV: 69.8 fL — ABNORMAL LOW (ref 78.0–100.0)
Platelets: 332 10*3/uL (ref 150–400)
RBC: 6.3 MIL/uL — ABNORMAL HIGH (ref 4.22–5.81)
RDW: 15.3 % (ref 11.5–15.5)
WBC: 7.7 10*3/uL (ref 4.0–10.5)

## 2017-05-20 LAB — URINALYSIS, ROUTINE W REFLEX MICROSCOPIC
BACTERIA UA: NONE SEEN
Bilirubin Urine: NEGATIVE
Glucose, UA: NEGATIVE mg/dL
KETONES UR: NEGATIVE mg/dL
NITRITE: NEGATIVE
PROTEIN: NEGATIVE mg/dL
Specific Gravity, Urine: 1.021 (ref 1.005–1.030)
Squamous Epithelial / LPF: NONE SEEN
pH: 5 (ref 5.0–8.0)

## 2017-05-20 LAB — COMPREHENSIVE METABOLIC PANEL
ALBUMIN: 4.2 g/dL (ref 3.5–5.0)
ALT: 17 U/L (ref 17–63)
ANION GAP: 9 (ref 5–15)
AST: 24 U/L (ref 15–41)
Alkaline Phosphatase: 73 U/L (ref 38–126)
BUN: 10 mg/dL (ref 6–20)
CO2: 27 mmol/L (ref 22–32)
Calcium: 9.6 mg/dL (ref 8.9–10.3)
Chloride: 103 mmol/L (ref 101–111)
Creatinine, Ser: 1.02 mg/dL (ref 0.61–1.24)
GFR calc Af Amer: >60 mL/min (ref >60)
GFR calc non Af Amer: >60 mL/min (ref >60)
GLUCOSE: 95 mg/dL (ref 65–99)
POTASSIUM: 3.7 mmol/L (ref 3.5–5.1)
SODIUM: 139 mmol/L (ref 135–145)
Total Bilirubin: 0.7 mg/dL (ref 0.3–1.2)
Total Protein: 7.2 g/dL (ref 6.5–8.1)

## 2017-05-20 MED ORDER — SUCRALFATE 1 G PO TABS: 1.0000 g | ORAL_TABLET | Freq: Three times a day (TID) | ORAL | 1 refills | Status: DC

## 2017-05-20 MED ORDER — GI COCKTAIL ~~LOC~~
30.0000 mL | Freq: Once | ORAL | Status: AC
Start: 2017-05-20 — End: 2017-05-20
  Administered 2017-05-20: 30 mL via ORAL
  Filled 2017-05-20: qty 30

## 2017-05-20 MED ORDER — OMEPRAZOLE 20 MG PO CPDR: 20.0000 mg | DELAYED_RELEASE_CAPSULE | Freq: Every day | ORAL | 0 refills | Status: DC

## 2017-05-20 MED ORDER — LIDOCAINE VISCOUS 2 % MT SOLN: 10.0000 mL | Freq: Four times a day (QID) | OROMUCOSAL | 0 refills | Status: DC | PRN

## 2017-05-20 NOTE — ED Notes (Signed)
Pt returned to room from US.

## 2017-05-20 NOTE — Discharge Instructions (Signed)
Do not hesitate to return to the Emergency Department for any new, worsening or concerning symptoms.  ° °If you do not have a primary care doctor you can establish one at the  ° °[facility]: °[address] °[place] [address] °[phone] ° °After you establish care. Let them know you were seen in the emergency room. They must obtain records for further management.  ° ° °

## 2017-05-20 NOTE — ED Notes (Signed)
Patient transported to Ultrasound 

## 2017-05-20 NOTE — ED Provider Notes (Signed)
[facility]-EMERGENCY DEPT Provider Note   CSN: [id number] Arrival date & time: 05/20/17  7425     History   Chief Complaint Chief Complaint  Patient presents with  . Abdominal Pain    HPI   Blood pressure 124/78, pulse 70, temperature 98.1 F (36.7 C), temperature source Oral, resp. rate 18, height 5\' 7"  (1.702 m), weight 68 kg (150 lb), SpO2 100 %.  Troy Ramos is a 36 y.o. male complaining of 2 days of intermittent diffuse abdominal pain and is not associated with any nausea, vomiting, diarrhea, change in urination, fever. He states that the pain is severe and woke him out of sleep at night. He's not tried any over-the-counter pain medications. He states that the pain is made worse by deep breathing. He denies cough or chest pain or abnormal lifting or breathing.   Past Medical History:  Diagnosis Date  . Chronic back pain   . Herniated lumbar intervertebral disc     There are no active problems to display for this patient.   History reviewed. No pertinent surgical history.     Home Medications    Prior to Admission medications   Medication Sig Start Date End Date Taking? Authorizing Provider  ciprofloxacin (CIPRO) 500 MG tablet Take 1 tablet (500 mg total) by mouth 2 (two) times daily. One po bid x 7 days 12/05/16   Molpus, John, MD  lidocaine (XYLOCAINE) 2 % solution Use as directed 10 mLs in the mouth or throat every 6 (six) hours as needed (pain). 05/20/17   Cloa Bushong, Joni Reining, PA-C  omeprazole (PRILOSEC) 20 MG capsule Take 1 capsule (20 mg total) by mouth daily. 05/20/17   Delena Casebeer, Joni Reining, PA-C  oxyCODONE-acetaminophen (PERCOCET) 5-325 MG tablet Take 1-2 tablets by mouth every 6 (six) hours as needed (for pain). 12/05/16   Molpus, John, MD  sucralfate (CARAFATE) 1 g tablet Take 1 tablet (1 g total) by mouth 4 (four) times daily -  with meals and at bedtime. 05/20/17   Jaelon Gatley, Mardella Layman    Family History No family history on file.  Social History Social  History  Substance Use Topics  . Smoking status: Current Every Day Smoker    Packs/day: 0.50    Years: 23.00    Types: Cigarettes  . Smokeless tobacco: Never Used  . Alcohol use Yes     Comment: only dranks on weekends     Allergies   Pumpkin flavor and Ibuprofen   Review of Systems Review of Systems  A complete review of systems was obtained and all systems are negative except as noted in the HPI and PMH.    Physical Exam Updated Vital Signs BP 134/87   Pulse 69   Temp 98.1 F (36.7 C) (Oral)   Resp 18   Ht 5\' 7"  (1.702 m)   Wt 68 kg (150 lb)   SpO2 99%   BMI 23.49 kg/m   Physical Exam  Constitutional: He is oriented to person, place, and time. He appears well-developed and well-nourished. No distress.  HENT:  Head: Normocephalic and atraumatic.  Mouth/Throat: Oropharynx is clear and moist.  Eyes: Conjunctivae and EOM are normal. Pupils are equal, round, and reactive to light.  Neck: Normal range of motion.  Cardiovascular: Normal rate, regular rhythm and intact distal pulses.   Pulmonary/Chest: Effort normal and breath sounds normal.  Abdominal: Soft. He exhibits no distension and no mass. There is tenderness. There is no rebound and no guarding. No hernia.  Right upper quadrant pain  with no guarding or rebound  Musculoskeletal: Normal range of motion.  Neurological: He is alert and oriented to person, place, and time.  Skin: Capillary refill takes less than 2 seconds. He is not diaphoretic.  Psychiatric: He has a normal mood and affect.  Nursing note and vitals reviewed.    ED Treatments / Results  Labs (all labs ordered are listed, but only abnormal results are displayed) Labs Reviewed  CBC - Abnormal; Notable for the following:       Result Value   RBC 6.30 (*)    MCV 69.8 (*)    MCH 22.9 (*)    All other components within normal limits  URINALYSIS, ROUTINE W REFLEX MICROSCOPIC - Abnormal; Notable for the following:    Hgb urine dipstick SMALL (*)     Leukocytes, UA TRACE (*)    All other components within normal limits  LIPASE, BLOOD  COMPREHENSIVE METABOLIC PANEL  GC/CHLAMYDIA PROBE AMP ([place]) NOT AT [facility]    EKG  EKG Interpretation None       Radiology US Abdomen Limited Ruq  Result Date: 05/20/2017 CLINICAL DATA:  Right upper quadrant pain for 2 days. EXAM: ULTRASOUND ABDOMEN LIMITED RIGHT UPPER QUADRANT COMPARISON:  None. FINDINGS: Gallbladder: No gallstones or wall thickening visualized. No sonographic Murphy sign noted by sonographer. Common bile duct: Diameter: 4 mm, within normal limits. Liver: No focal lesion identified. Within normal limits in parenchymal echogenicity. IMPRESSION: Normal study.  No hepatobiliary abnormality identified. Electronically Signed   By: Myles Rosenthal M.D.   On: 05/20/2017 07:54    Procedures Procedures (including critical care time)  Medications Ordered in ED Medications  gi cocktail (Maalox,Lidocaine,Donnatal) (30 mLs Oral Given 05/20/17 0755)     Initial Impression / Assessment and Plan / ED Course  I have reviewed the triage vital signs and the nursing notes.  Pertinent labs & imaging results that were available during my care of the patient were reviewed by me and considered in my medical decision making (see chart for details).    Vitals:   05/20/17 0700 05/20/17 0730 05/20/17 0800 05/20/17 0830  BP: 122/82 129/81 124/83 134/87  Pulse: 67 67 69 69  Resp:      Temp:      TempSrc:      SpO2: 100% 100% 100% 99%  Weight:      Height:        Medications  gi cocktail (Maalox,Lidocaine,Donnatal) (30 mLs Oral Given 05/20/17 0755)    Val Troy Ramos is 36 y.o. male presenting with Epigastric abdominal pain. He is tender in the right upper quadrant however blood work is without abnormality. This is likely reflux or peptic ulcer disease. Will give GI cocktail and Ativan abundance of caution check a right upper quadrant ultrasound.  Gisela negative, patient significantly  improved after GI cocktail. Advised him this is likely reflux and possibly peptic ulcer disease, he does not have a primary care physician up given him a referral to [facility].   Evaluation does not show pathology that would require ongoing emergent intervention or inpatient treatment. Pt is hemodynamically stable and mentating appropriately. Discussed findings and plan with patient/guardian, who agrees with care plan. All questions answered. Return precautions discussed and outpatient follow up given.    Final Clinical Impressions(s) / ED Diagnoses   Final diagnoses:  Pain  Gastroesophageal reflux disease, esophagitis presence not specified    New Prescriptions Discharge Medication List as of 05/20/2017  8:39 AM    START  taking these medications   Details  lidocaine (XYLOCAINE) 2 % solution Use as directed 10 mLs in the mouth or throat every 6 (six) hours as needed (pain)., Starting Wed 05/20/2017, Print    omeprazole (PRILOSEC) 20 MG capsule Take 1 capsule (20 mg total) by mouth daily., Starting Wed 05/20/2017, Print    sucralfate (CARAFATE) 1 g tablet Take 1 tablet (1 g total) by mouth 4 (four) times daily -  with meals and at bedtime., Starting Wed 05/20/2017, Print         Everlena Mackley, [place], PA-C 05/20/17 [id number]    Abelino DerrickMackuen, Courteney Lyn, MD 05/22/17 2233

## 2017-05-20 NOTE — ED Triage Notes (Signed)
Pt reports gen abd pain X2 days, denies N/V/D. Noted to be tender more RUQ when palpating. Pt reports dec in appetite.

## 2018-05-14 ENCOUNTER — Emergency Department (HOSPITAL_COMMUNITY)
Admission: EM | Admit: 2018-05-14 | Discharge: 2018-05-14 | Disposition: A | Discharge status: Home / Self Care | Payer: Self-pay | Attending: Emergency Medicine | Admitting: Emergency Medicine

## 2018-05-14 ENCOUNTER — Other Ambulatory Visit: Payer: Self-pay

## 2018-05-14 ENCOUNTER — Encounter (HOSPITAL_COMMUNITY): Payer: Self-pay | Admitting: Emergency Medicine

## 2018-05-14 DIAGNOSIS — I4891 Unspecified atrial fibrillation: Secondary | ICD-10-CM | POA: Insufficient documentation

## 2018-05-14 DIAGNOSIS — R112 Nausea with vomiting, unspecified: Secondary | ICD-10-CM | POA: Insufficient documentation

## 2018-05-14 DIAGNOSIS — F17210 Nicotine dependence, cigarettes, uncomplicated: Secondary | ICD-10-CM | POA: Insufficient documentation

## 2018-05-14 DIAGNOSIS — R197 Diarrhea, unspecified: Secondary | ICD-10-CM | POA: Insufficient documentation

## 2018-05-14 LAB — CBC
HCT: 44.3 % (ref 39.0–52.0)
HEMOGLOBIN: 13.9 g/dL (ref 13.0–17.0)
MCH: 22.7 pg — AB (ref 26.0–34.0)
MCHC: 31.4 g/dL (ref 30.0–36.0)
MCV: 72.3 fL — ABNORMAL LOW (ref 78.0–100.0)
PLATELETS: 298 10*3/uL (ref 150–400)
RBC: 6.13 MIL/uL — AB (ref 4.22–5.81)
RDW: 15.6 % — ABNORMAL HIGH (ref 11.5–15.5)
WBC: 8.9 10*3/uL (ref 4.0–10.5)

## 2018-05-14 LAB — COMPREHENSIVE METABOLIC PANEL
ALK PHOS: 77 U/L (ref 38–126)
ALT: 16 U/L (ref 0–44)
ANION GAP: 11 (ref 5–15)
AST: 28 U/L (ref 15–41)
Albumin: 4.7 g/dL (ref 3.5–5.0)
BILIRUBIN TOTAL: 0.5 mg/dL (ref 0.3–1.2)
BUN: 12 mg/dL (ref 6–20)
CALCIUM: 9.8 mg/dL (ref 8.9–10.3)
CO2: 28 mmol/L (ref 22–32)
CREATININE: 1.02 mg/dL (ref 0.61–1.24)
Chloride: 100 mmol/L (ref 98–111)
GFR calc Af Amer: >60 mL/min (ref >60)
GFR calc non Af Amer: >60 mL/min (ref >60)
GLUCOSE: 126 mg/dL — AB (ref 70–99)
Potassium: 3.8 mmol/L (ref 3.5–5.1)
Sodium: 139 mmol/L (ref 135–145)
TOTAL PROTEIN: 7.8 g/dL (ref 6.5–8.1)

## 2018-05-14 LAB — LIPASE, BLOOD: Lipase: 24 U/L (ref 11–51)

## 2018-05-14 MED ORDER — SODIUM CHLORIDE 0.9 % IV BOLUS
1000.0000 mL | Freq: Once | INTRAVENOUS | Status: AC
  Administered 2018-05-14: 1000 mL via INTRAVENOUS

## 2018-05-14 MED ORDER — ONDANSETRON HCL 4 MG/2ML IJ SOLN
4.0000 mg | Freq: Once | INTRAMUSCULAR | Status: AC
  Administered 2018-05-14: 4 mg via INTRAVENOUS
  Filled 2018-05-14: qty 2

## 2018-05-14 MED ORDER — ONDANSETRON 4 MG PO TBDP: 4.0000 mg | ORAL_TABLET | Freq: Three times a day (TID) | ORAL | 0 refills | Status: DC | PRN

## 2018-05-14 MED ORDER — MORPHINE SULFATE (PF) 4 MG/ML IV SOLN
4.0000 mg | Freq: Once | INTRAVENOUS | Status: AC
  Administered 2018-05-14: 4 mg via INTRAVENOUS
  Filled 2018-05-14: qty 1

## 2018-05-14 MED ORDER — ONDANSETRON 4 MG PO TBDP
4.0000 mg | ORAL_TABLET | Freq: Once | ORAL | Status: AC | PRN
  Administered 2018-05-14: 4 mg via ORAL
  Filled 2018-05-14: qty 1

## 2018-05-14 NOTE — ED Triage Notes (Signed)
Pt began having n/v/d with abdominal pain about 3 hours ago. Chills, no fever.

## 2018-05-14 NOTE — ED Provider Notes (Addendum)
[facility]   CSN: [id number] Arrival date & time: 05/14/18  0421     History   Chief Complaint Chief Complaint  Patient presents with  . Abdominal Pain  . Emesis    HPI Troy Ramos is a 37 y.o. male.  Patient presents to the emergency department with a chief complaint of nausea, vomiting, diarrhea.  He states his symptoms came on about 3 hours ago.  He denies any known sick contacts.  Denies eating any unusual foods.  Denies any fever chills.  Denies any dysuria or hematuria.  He complains of crampy generalized abdominal pain.  He was given Zofran in triage with mild relief.  The history is provided by the patient. No language interpreter was used.    Past Medical History:  Diagnosis Date  . Chronic back pain   . Herniated lumbar intervertebral disc     There are no active problems to display for this patient.   History reviewed. No pertinent surgical history.      Home Medications    Prior to Admission medications   Medication Sig Start Date End Date Taking? Authorizing Provider  ciprofloxacin (CIPRO) 500 MG tablet Take 1 tablet (500 mg total) by mouth 2 (two) times daily. One po bid x 7 days 12/05/16   Molpus, John, MD  lidocaine (XYLOCAINE) 2 % solution Use as directed 10 mLs in the mouth or throat every 6 (six) hours as needed (pain). 05/20/17   Pisciotta, Joni ReiningNicole, PA-C  omeprazole (PRILOSEC) 20 MG capsule Take 1 capsule (20 mg total) by mouth daily. 05/20/17   Pisciotta, Joni ReiningNicole, PA-C  oxyCODONE-acetaminophen (PERCOCET) 5-325 MG tablet Take 1-2 tablets by mouth every 6 (six) hours as needed (for pain). 12/05/16   Molpus, John, MD  sucralfate (CARAFATE) 1 g tablet Take 1 tablet (1 g total) by mouth 4 (four) times daily -  with meals and at bedtime. 05/20/17   Pisciotta, Mardella LaymanNicole, PA-C    Family History No family history on file.  Social History Social History   Tobacco Use  . Smoking status: Current Every Day  Smoker    Packs/day: 0.50    Years: 23.00    Pack years: 11.50    Types: Cigarettes  . Smokeless tobacco: Never Used  Substance Use Topics  . Alcohol use: Yes    Comment: only dranks on weekends  . Drug use: Yes    Types: Marijuana    Comment: last used this morning 01/23/16 at 0630     Allergies   Pumpkin flavor and Ibuprofen   Review of Systems Review of Systems  All other systems reviewed and are negative.    Physical Exam Updated Vital Signs BP 118/86   Pulse 85   Temp 97.8 F (36.6 C) (Oral)   Resp 16   Ht 5\' 7"  (1.702 m)   Wt 67.1 kg (148 lb)   SpO2 100%   BMI 23.18 kg/m   Physical Exam  Constitutional: He is oriented to person, place, and time. He appears well-developed and well-nourished.  HENT:  Head: Normocephalic and atraumatic.  Eyes: Pupils are equal, round, and reactive to light. Conjunctivae and EOM are normal. Right eye exhibits no discharge. Left eye exhibits no discharge. No scleral icterus.  Neck: Normal range of motion. Neck supple. No JVD present.  Cardiovascular: Normal rate, regular rhythm and normal heart sounds. Exam reveals no gallop and no friction rub.  No murmur heard. Pulmonary/Chest: Effort normal and breath sounds  normal. No respiratory distress. He has no wheezes. He has no rales. He exhibits no tenderness.  Abdominal: Soft. He exhibits no distension and no mass. There is generalized tenderness. There is no rebound and no guarding.  Musculoskeletal: Normal range of motion. He exhibits no edema or tenderness.  Neurological: He is alert and oriented to person, place, and time.  Skin: Skin is warm and dry.  Psychiatric: He has a normal mood and affect. His behavior is normal. Judgment and thought content normal.  Nursing note and vitals reviewed.    ED Treatments / Results  Labs (all labs ordered are listed, but only abnormal results are displayed) Labs Reviewed  COMPREHENSIVE METABOLIC PANEL - Abnormal; Notable for the  following components:      Result Value   Glucose, Bld 126 (*)    All other components within normal limits  CBC - Abnormal; Notable for the following components:   RBC 6.13 (*)    MCV 72.3 (*)    MCH 22.7 (*)    RDW 15.6 (*)    All other components within normal limits  LIPASE, BLOOD    EKG EKG Interpretation  Date/Time:  Friday May 14 2018 06:08:46 EDT Ventricular Rate:  78 PR Interval:    QRS Duration: 91 QT Interval:  386 QTC Calculation: 440 R Axis:   83 Text Interpretation:  Atrial fibrillation Ventricular premature complex Confirmed by Palumbo, April ([id number]) on 05/14/2018 6:11:49 AM   Radiology No results found.  Procedures Procedures (including critical care time)  Medications Ordered in ED Medications  morphine 4 MG/ML injection 4 mg (has no administration in time range)  ondansetron (ZOFRAN) injection 4 mg (has no administration in time range)  sodium chloride 0.9 % bolus 1,000 mL (has no administration in time range)  ondansetron (ZOFRAN-ODT) disintegrating tablet 4 mg (4 mg Oral Given 05/14/18 0433)     Initial Impression / Assessment and Plan / ED Course  I have reviewed the triage vital signs and the nursing notes.  Pertinent labs & imaging results that were available during my care of the patient were reviewed by me and considered in my medical decision making (see chart for details).     Patient with generalized abdominal discomfort.  He has associated nausea, vomiting, diarrhea.  Will check labs, give fluids, treat symptoms, and reassess.  Likely viral etiology given sudden onset and vomiting and diarrhea.  6:07 AM Lab work-up is reassuring.  No leukocytosis.  Patient feels improved.  Abdomen has no focal tenderness, doubt surgical or acute abdomen.  Will discharge to home with Zofran.  Recommend PCP follow-up in 2 to 3 days.  6:11 AM On monitor noted to be in A. fib.  EKG is consistent with A. fib.  He is rate controlled.  Chads 2 vasc score is  0.  Will recommend follow-up in afib clinic.  Final Clinical Impressions(s) / ED Diagnoses   Final diagnoses:  Nausea vomiting and diarrhea    ED Discharge Orders        Ordered    ondansetron (ZOFRAN ODT) 4 MG disintegrating tablet  Every 8 hours PRN     05/14/18 0606           Roxy Horseman, PA-C 05/14/18 6045    Geoffery Lyons, MD 05/14/18 [phone]

## 2018-12-12 ENCOUNTER — Emergency Department (HOSPITAL_COMMUNITY)
Admission: EM | Admit: 2018-12-12 | Discharge: 2018-12-12 | Disposition: A | Discharge status: Home / Self Care | Payer: Self-pay | Attending: Emergency Medicine | Admitting: Emergency Medicine

## 2018-12-12 ENCOUNTER — Other Ambulatory Visit: Payer: Self-pay

## 2018-12-12 ENCOUNTER — Encounter (HOSPITAL_COMMUNITY): Payer: Self-pay

## 2018-12-12 ENCOUNTER — Emergency Department (HOSPITAL_COMMUNITY): Payer: Self-pay

## 2018-12-12 DIAGNOSIS — F17210 Nicotine dependence, cigarettes, uncomplicated: Secondary | ICD-10-CM | POA: Insufficient documentation

## 2018-12-12 DIAGNOSIS — J181 Lobar pneumonia, unspecified organism: Secondary | ICD-10-CM | POA: Insufficient documentation

## 2018-12-12 DIAGNOSIS — J189 Pneumonia, unspecified organism: Secondary | ICD-10-CM

## 2018-12-12 LAB — CBG MONITORING, ED: Glucose-Capillary: 95 mg/dL (ref 70–99)

## 2018-12-12 LAB — URINALYSIS, ROUTINE W REFLEX MICROSCOPIC
BACTERIA UA: NONE SEEN
Bilirubin Urine: NEGATIVE
GLUCOSE, UA: NEGATIVE mg/dL
Ketones, ur: 5 mg/dL — AB
Leukocytes, UA: NEGATIVE
Nitrite: NEGATIVE
Protein, ur: 30 mg/dL — AB
Specific Gravity, Urine: 1.021 (ref 1.005–1.030)
pH: 5 (ref 5.0–8.0)

## 2018-12-12 MED ORDER — DOXYCYCLINE HYCLATE 100 MG PO CAPS: 100.0000 mg | ORAL_CAPSULE | Freq: Two times a day (BID) | ORAL | 0 refills | Status: DC

## 2018-12-12 NOTE — ED Triage Notes (Signed)
Pt with cold like symptoms that started Friday evening.

## 2018-12-12 NOTE — ED Provider Notes (Signed)
[facility] EMERGENCY DEPARTMENT Provider Note   CSN: [id number] Arrival date & time: 12/12/18  4585     History   Chief Complaint Chief Complaint  Patient presents with  . Generalized Body Aches    HPI Troy Ramos is a 38 y.o. male.  The history is provided by the patient. No language interpreter was used.  Cough  Cough characteristics:  Productive Severity:  Moderate Onset quality:  Gradual Duration:  2 days Timing:  Constant Progression:  Worsening Chronicity:  New Context: sick contacts   Relieved by:  Nothing Worsened by:  Nothing Ineffective treatments:  None tried Associated symptoms: myalgias   Associated symptoms: no shortness of breath     Past Medical History:  Diagnosis Date  . Chronic back pain   . Herniated lumbar intervertebral disc     There are no active problems to display for this patient.   History reviewed. No pertinent surgical history.      Home Medications    Prior to Admission medications   Medication Sig Start Date End Date Taking? Authorizing Provider  doxycycline (VIBRAMYCIN) 100 MG capsule Take 1 capsule (100 mg total) by mouth 2 (two) times daily. 12/12/18   Elson Areas, PA-C    Family History History reviewed. No pertinent family history.  Social History Social History   Tobacco Use  . Smoking status: Current Every Day Smoker    Packs/day: 0.50    Years: 23.00    Pack years: 11.50    Types: Cigarettes  . Smokeless tobacco: Never Used  Substance Use Topics  . Alcohol use: Yes    Comment: only dranks on weekends  . Drug use: Yes    Types: Marijuana    Comment: last used this morning 01/23/16 at 0630     Allergies   Pumpkin flavor and Ibuprofen   Review of Systems Review of Systems  Respiratory: Positive for cough. Negative for shortness of breath.   Musculoskeletal: Positive for myalgias.  All other systems reviewed and are negative.    Physical Exam Updated Vital Signs BP 113/81 (BP  Location: Right Arm)   Pulse 74   Temp 98.6 F (37 C) (Oral)   Resp 18   Ht 5\' 7"  (1.702 m)   Wt 66.7 kg   SpO2 100%   BMI 23.02 kg/m   Physical Exam Vitals signs and nursing note reviewed.  Constitutional:      Appearance: He is well-developed.  HENT:     Head: Normocephalic.     Right Ear: Tympanic membrane normal.     Left Ear: Tympanic membrane normal.     Nose: Nose normal.     Mouth/Throat:     Mouth: Mucous membranes are moist.  Eyes:     Pupils: Pupils are equal, round, and reactive to light.  Neck:     Musculoskeletal: Normal range of motion.  Cardiovascular:     Rate and Rhythm: Normal rate.  Pulmonary:     Effort: Pulmonary effort is normal.  Abdominal:     General: There is no distension.  Musculoskeletal: Normal range of motion.  Skin:    General: Skin is warm.  Neurological:     General: No focal deficit present.     Mental Status: He is alert and oriented to person, place, and time.  Psychiatric:        Mood and Affect: Mood normal.      ED Treatments / Results  Labs (all labs ordered are listed, but  only abnormal results are displayed) Labs Reviewed  URINALYSIS, ROUTINE W REFLEX MICROSCOPIC - Abnormal; Notable for the following components:      Result Value   APPearance HAZY (*)    Hgb urine dipstick SMALL (*)    Ketones, ur 5 (*)    Protein, ur 30 (*)    All other components within normal limits  URINE CULTURE  CBG MONITORING, ED    EKG None  Radiology Dg Chest 2 View  Result Date: 12/12/2018 CLINICAL DATA:  Cough, flu like symptoms EXAM: CHEST - 2 VIEW COMPARISON:  08/02/2012 FINDINGS: Mild patchy left lower lung opacity, overlying the posterior heart on the lateral view, favoring lingular pneumonia (less likely left lower lobe). Right lung is clear. No pleural effusion or pneumothorax. The heart is normal in size. Visualized osseous structures are within normal limits. IMPRESSION: Left lower lung pneumonia, as above. Electronically  Signed   By: Charline Bills M.D.   On: 12/12/2018 09:29    Procedures Procedures (including critical care time)  Medications Ordered in ED Medications - No data to display   Initial Impression / Assessment and Plan / ED Course  I have reviewed the triage vital signs and the nursing notes.  Pertinent labs & imaging results that were available during my care of the patient were reviewed by me and considered in my medical decision making (see chart for details).     MDM  Chest xray shows left lower lobe pneumonia.  Pt given rx for doxycycline.  Urine shows wbc.  I will culture urine   Final Clinical Impressions(s) / ED Diagnoses   Final diagnoses:  Community acquired pneumonia of left lower lobe of lung [facility])    ED Discharge Orders         Ordered    doxycycline (VIBRAMYCIN) 100 MG capsule  2 times daily     12/12/18 0943        An After Visit Summary was printed and given to the patient.    Osie Cheeks 12/12/18 1009    Eber Hong, MD 12/14/18 [phone]

## 2018-12-12 NOTE — Discharge Instructions (Signed)
See your Physicain for recheck if symptoms persist.  Tylenol for fevers.

## 2018-12-13 LAB — URINE CULTURE: CULTURE: NO GROWTH

## 2019-08-03 ENCOUNTER — Emergency Department (HOSPITAL_COMMUNITY): Payer: Self-pay

## 2019-08-03 ENCOUNTER — Emergency Department (HOSPITAL_COMMUNITY)
Admission: EM | Admit: 2019-08-03 | Discharge: 2019-08-03 | Disposition: A | Discharge status: Home / Self Care | Payer: Self-pay | Attending: Emergency Medicine | Admitting: Emergency Medicine

## 2019-08-03 ENCOUNTER — Other Ambulatory Visit: Payer: Self-pay

## 2019-08-03 ENCOUNTER — Encounter (HOSPITAL_COMMUNITY): Payer: Self-pay

## 2019-08-03 DIAGNOSIS — K529 Noninfective gastroenteritis and colitis, unspecified: Secondary | ICD-10-CM | POA: Insufficient documentation

## 2019-08-03 DIAGNOSIS — F17210 Nicotine dependence, cigarettes, uncomplicated: Secondary | ICD-10-CM | POA: Insufficient documentation

## 2019-08-03 LAB — COMPREHENSIVE METABOLIC PANEL
ALT: 15 U/L (ref 0–44)
AST: 24 U/L (ref 15–41)
Albumin: 4.1 g/dL (ref 3.5–5.0)
Alkaline Phosphatase: 68 U/L (ref 38–126)
Anion gap: 8 (ref 5–15)
BUN: 12 mg/dL (ref 6–20)
CO2: 26 mmol/L (ref 22–32)
Calcium: 9 mg/dL (ref 8.9–10.3)
Chloride: 104 mmol/L (ref 98–111)
Creatinine, Ser: 0.86 mg/dL (ref 0.61–1.24)
GFR calc Af Amer: >60 mL/min (ref >60)
GFR calc non Af Amer: >60 mL/min (ref >60)
Glucose, Bld: 81 mg/dL (ref 70–99)
Potassium: 3.5 mmol/L (ref 3.5–5.1)
Sodium: 138 mmol/L (ref 135–145)
Total Bilirubin: 0.3 mg/dL (ref 0.3–1.2)
Total Protein: 7.4 g/dL (ref 6.5–8.1)

## 2019-08-03 LAB — CBC WITH DIFFERENTIAL/PLATELET
Abs Immature Granulocytes: 0.02 10*3/uL (ref 0.00–0.07)
Basophils Absolute: 0.1 10*3/uL (ref 0.0–0.1)
Basophils Relative: 1 %
Eosinophils Absolute: 0.1 10*3/uL (ref 0.0–0.5)
Eosinophils Relative: 1 %
HCT: 44.2 % (ref 39.0–52.0)
Hemoglobin: 13.6 g/dL (ref 13.0–17.0)
Immature Granulocytes: 0 %
Lymphocytes Relative: 30 %
Lymphs Abs: 2.4 10*3/uL (ref 0.7–4.0)
MCH: 22.7 pg — ABNORMAL LOW (ref 26.0–34.0)
MCHC: 30.8 g/dL (ref 30.0–36.0)
MCV: 73.8 fL — ABNORMAL LOW (ref 80.0–100.0)
Monocytes Absolute: 0.6 10*3/uL (ref 0.1–1.0)
Monocytes Relative: 7 %
Neutro Abs: 4.7 10*3/uL (ref 1.7–7.7)
Neutrophils Relative %: 61 %
Platelets: 290 10*3/uL (ref 150–400)
RBC: 5.99 MIL/uL — ABNORMAL HIGH (ref 4.22–5.81)
RDW: 16.3 % — ABNORMAL HIGH (ref 11.5–15.5)
WBC: 7.8 10*3/uL (ref 4.0–10.5)
nRBC: 0 % (ref 0.0–0.2)

## 2019-08-03 LAB — LIPASE, BLOOD: Lipase: 34 U/L (ref 11–51)

## 2019-08-03 MED ORDER — ONDANSETRON 4 MG PO TBDP: 4.0000 mg | ORAL_TABLET | Freq: Three times a day (TID) | ORAL | 0 refills | Status: DC | PRN

## 2019-08-03 MED ORDER — ONDANSETRON HCL 4 MG/2ML IJ SOLN
4.0000 mg | Freq: Once | INTRAMUSCULAR | Status: AC
  Administered 2019-08-03: 4 mg via INTRAVENOUS
  Filled 2019-08-03: qty 2

## 2019-08-03 MED ORDER — CIPROFLOXACIN HCL 500 MG PO TABS: 500.0000 mg | ORAL_TABLET | Freq: Two times a day (BID) | ORAL | 0 refills | Status: DC

## 2019-08-03 MED ORDER — METRONIDAZOLE 500 MG PO TABS: 500.0000 mg | ORAL_TABLET | Freq: Two times a day (BID) | ORAL | 0 refills | Status: DC

## 2019-08-03 MED ORDER — IOHEXOL 300 MG/ML  SOLN
100.0000 mL | Freq: Once | INTRAMUSCULAR | Status: AC | PRN
  Administered 2019-08-03: 100 mL via INTRAVENOUS

## 2019-08-03 NOTE — ED Triage Notes (Signed)
Pt came from home c/o abdominal pain that started last night. N/V when laying flat. No fevers or other other complaints at this time.

## 2019-08-03 NOTE — Discharge Instructions (Addendum)
Frequent, small sips of clear fluids today, bland diet as tolerated.  Start the antibiotics in 24-48 hrs if your symptoms are not improving.  Return here if your develop worsening pain, persistent vomiting or fever.

## 2019-08-03 NOTE — ED Provider Notes (Signed)
[facility] EMERGENCY DEPARTMENT Provider Note   CSN: [id number] Arrival date & time: 08/03/19  6256     History   Chief Complaint Chief Complaint  Patient presents with   Abdominal Pain    HPI Troy Ramos is a 38 y.o. male.     HPI   Troy Ramos is a 38 y.o. male who presents to the Emergency Department complaining of generalized abdominal pain that began early this morning.  States that he works third shift and ate breakfast prior to going to bed Tuesday morning.  Woke with persistent pain across his abdomen and associated nausea and vomiting.  Also has a loss of appetite and pain radiates into his right back.  He denies fever, diarrhea, chills, cough or chest pain.  No previous abdominal surgeries   Past Medical History:  Diagnosis Date   Chronic back pain    Herniated lumbar intervertebral disc     There are no active problems to display for this patient.   History reviewed. No pertinent surgical history.    Home Medications    Prior to Admission medications   Not on File    Family History History reviewed. No pertinent family history.  Social History Social History   Tobacco Use   Smoking status: Current Every Day Smoker    Packs/day: 0.50    Years: 23.00    Pack years: 11.50    Types: Cigarettes   Smokeless tobacco: Never Used  Substance Use Topics   Alcohol use: Yes    Comment: only dranks on weekends   Drug use: Yes    Types: Marijuana    Comment: last used this morning 01/23/16 at 0630     Allergies   Pumpkin flavor and Ibuprofen   Review of Systems Review of Systems  Constitutional: Positive for appetite change. Negative for chills and fever.  Respiratory: Negative for chest tightness and shortness of breath.   Cardiovascular: Negative for chest pain.  Gastrointestinal: Positive for abdominal pain, nausea and vomiting. Negative for abdominal distention, blood in stool, constipation and diarrhea.  Genitourinary:  Negative for decreased urine volume, difficulty urinating, dysuria and flank pain.  Musculoskeletal: Positive for back pain.  Skin: Negative for color change and rash.  Neurological: Negative for dizziness, weakness and numbness.  Hematological: Negative for adenopathy.     Physical Exam Updated Vital Signs BP (!) 123/91    Pulse 89    Temp 98.1 F (36.7 C) (Oral)    Resp 16    Ht 5' 7.5" (1.715 m)    Wt 66.2 kg    SpO2 100%    BMI 22.53 kg/m   Physical Exam Vitals signs and nursing note reviewed.  Constitutional:      Appearance: He is well-developed. He is not ill-appearing or toxic-appearing.  HENT:     Head: Atraumatic.     Mouth/Throat:     Mouth: Mucous membranes are moist.     Pharynx: Oropharynx is clear.  Cardiovascular:     Rate and Rhythm: Normal rate and regular rhythm.     Heart sounds: Normal heart sounds.  Pulmonary:     Effort: Pulmonary effort is normal.     Breath sounds: Normal breath sounds. No wheezing.  Chest:     Chest wall: No tenderness.  Abdominal:     General: Abdomen is flat.     Tenderness: There is abdominal tenderness in the right lower quadrant and periumbilical area. There is no guarding.  Comments: ttp of the RLQ.  No guarding or rebound.    Musculoskeletal: Normal range of motion.     Right lower leg: No edema.     Left lower leg: No edema.  Skin:    General: Skin is warm.     Findings: No rash.  Neurological:     General: No focal deficit present.     Mental Status: He is alert.     Sensory: No sensory deficit.     Motor: No weakness.      ED Treatments / Results  Labs (all labs ordered are listed, but only abnormal results are displayed) Labs Reviewed  CBC WITH DIFFERENTIAL/PLATELET - Abnormal; Notable for the following components:      Result Value   RBC 5.99 (*)    MCV 73.8 (*)    MCH 22.7 (*)    RDW 16.3 (*)    All other components within normal limits  COMPREHENSIVE METABOLIC PANEL  LIPASE, BLOOD  URINALYSIS,  ROUTINE W REFLEX MICROSCOPIC    EKG None  Radiology Ct Abdomen Pelvis W Contrast  Result Date: 08/03/2019 CLINICAL DATA:  Nausea, vomiting, generalized pain EXAM: CT ABDOMEN AND PELVIS WITH CONTRAST TECHNIQUE: Multidetector CT imaging of the abdomen and pelvis was performed using the standard protocol following bolus administration of intravenous contrast. CONTRAST:  OMNIPAQUE IOHEXOL 300 MG/ML  SOLN COMPARISON:  11/25/2016 FINDINGS: Lower chest: No acute abnormality. Hepatobiliary: No focal liver abnormality is seen. No gallstones, gallbladder wall thickening, or biliary dilatation. Pancreas: Unremarkable. No pancreatic ductal dilatation or surrounding inflammatory changes. Spleen: Normal in size without focal abnormality. Adrenals/Urinary Tract: Adrenal glands are unremarkable. Kidneys are normal, without renal calculi, focal lesion, or hydronephrosis. Bladder is unremarkable. Stomach/Bowel: Stomach is within normal limits. Appendix appears normal. No evidence of bowel wall thickening, distention, or inflammatory changes. Small amount of fluid in the stomach and fluid-filled loops of small bowel and colon a small amount of fluid in the colon as can be seen with enterocolitis. Vascular/Lymphatic: No significant vascular findings are present. No enlarged abdominal or pelvic lymph nodes. Reproductive: Prostate is unremarkable. Other: No abdominal wall hernia or abnormality. No abdominopelvic ascites. Musculoskeletal: Stable bilateral sacroiliitis with subchondral sclerosis. No aggressive osseous lesion. No periosteal reaction or bone destruction. IMPRESSION: 1. Small amount of fluid in the stomach and fluid-filled loops of small bowel and colon a small amount of fluid in the colon as can be seen with enterocolitis. 2. Stable bilateral sacroiliitis which may be secondary to osteoarthritis versus an inflammatory arthropathy or enteropathic arthritis Electronically Signed   By: Elige Ko   On:  08/03/2019 12:07    Procedures Procedures (including critical care time)  Medications Ordered in ED Medications  ondansetron (ZOFRAN) injection 4 mg (has no administration in time range)     Initial Impression / Assessment and Plan / ED Course  I have reviewed the triage vital signs and the nursing notes.  Pertinent labs & imaging results that were available during my care of the patient were reviewed by me and considered in my medical decision making (see chart for details).        1300  Discussed CT findings and work up with pt.  Vitals reviewed, non toxic appearing. He states that he needs to leave to pick up his child.  Reports feeling better.  I will prescribe Cipro flagyl with understanding that he will try symptomatic tx first, and only start the abx if sx's are not improving in 24-48 hrs.  Strict return precautions discussed  Final Clinical Impressions(s) / ED Diagnoses   Final diagnoses:  Enterocolitis    ED Discharge Orders    None       Bufford Lope 08/07/19 2006    Virgel Manifold, MD 08/10/19 [phone]

## 2019-09-19 ENCOUNTER — Other Ambulatory Visit: Payer: Self-pay

## 2019-09-19 ENCOUNTER — Encounter (HOSPITAL_COMMUNITY): Payer: Self-pay | Admitting: *Deleted

## 2019-09-19 ENCOUNTER — Emergency Department (HOSPITAL_COMMUNITY)
Admission: EM | Admit: 2019-09-19 | Discharge: 2019-09-20 | Disposition: A | Discharge status: Home / Self Care | Payer: Medicaid Other | Attending: Emergency Medicine | Admitting: Emergency Medicine

## 2019-09-19 DIAGNOSIS — R1115 Cyclical vomiting syndrome unrelated to migraine: Secondary | ICD-10-CM | POA: Insufficient documentation

## 2019-09-19 DIAGNOSIS — F17210 Nicotine dependence, cigarettes, uncomplicated: Secondary | ICD-10-CM | POA: Insufficient documentation

## 2019-09-19 MED ORDER — ONDANSETRON HCL 4 MG/2ML IJ SOLN
4.0000 mg | Freq: Once | INTRAMUSCULAR | Status: AC
  Administered 2019-09-19: 4 mg via INTRAVENOUS
  Filled 2019-09-19: qty 2

## 2019-09-19 MED ORDER — DIPHENHYDRAMINE HCL 50 MG/ML IJ SOLN
25.0000 mg | Freq: Once | INTRAMUSCULAR | Status: AC
  Administered 2019-09-19: 25 mg via INTRAVENOUS
  Filled 2019-09-19: qty 1

## 2019-09-19 MED ORDER — SODIUM CHLORIDE 0.9 % IV BOLUS (SEPSIS)
1000.0000 mL | Freq: Once | INTRAVENOUS | Status: AC
  Administered 2019-09-19: 1000 mL via INTRAVENOUS

## 2019-09-19 MED ORDER — METOCLOPRAMIDE HCL 5 MG/ML IJ SOLN
10.0000 mg | Freq: Once | INTRAMUSCULAR | Status: AC
  Administered 2019-09-19: 10 mg via INTRAVENOUS
  Filled 2019-09-19: qty 2

## 2019-09-19 NOTE — ED Triage Notes (Signed)
Pt c/o abdominal pain with n/v that started 5 hours ago

## 2019-09-19 NOTE — ED Provider Notes (Signed)
[facility] EMERGENCY DEPARTMENT Provider Note   CSN: [id number] Arrival date & time: 09/19/19  2244     History   Chief Complaint Chief Complaint  Patient presents with  . Abdominal Pain    HPI Troy Ramos is a 38 y.o. male.     The history is provided by the patient.  Emesis Severity:  Severe Timing:  Intermittent Quality:  Stomach contents Progression:  Worsening Chronicity:  New Relieved by:  Nothing Worsened by:  Nothing Associated symptoms: abdominal pain   Associated symptoms: no cough, no diarrhea and no fever   Patient reports he began vomiting approximately 6 hours ago after eating at a [employer].  He reports multiple episodes of emesis that are nonbloody.  He reports mild epigastric abdominal pain. He had otherwise been feeling well.  No chest pain.   Past Medical History:  Diagnosis Date  . Chronic back pain   . Herniated lumbar intervertebral disc     There are no active problems to display for this patient.   History reviewed. No pertinent surgical history.      Home Medications    Prior to Admission medications   Not on File    Family History History reviewed. No pertinent family history.  Social History Social History   Tobacco Use  . Smoking status: Current Every Day Smoker    Packs/day: 0.50    Years: 23.00    Pack years: 11.50    Types: Cigarettes  . Smokeless tobacco: Never Used  Substance Use Topics  . Alcohol use: Yes    Comment: only dranks on weekends  . Drug use: Yes    Types: Marijuana    Comment: last used this morning 01/23/16 at 0630     Allergies   Pumpkin flavor and Ibuprofen   Review of Systems Review of Systems  Constitutional: Negative for fever.  Respiratory: Negative for cough.   Cardiovascular: Negative for leg swelling.  Gastrointestinal: Positive for abdominal pain, nausea and vomiting. Negative for diarrhea.  All other systems reviewed and are negative.    Physical Exam  Updated Vital Signs BP (!) 156/101 (BP Location: Right Arm)   Pulse 79   Temp 97.7 F (36.5 C) (Oral)   Resp 20   Ht 1.715 m (5' 7.5")   Wt 65.3 kg   SpO2 100%   BMI 22.22 kg/m   Physical Exam CONSTITUTIONAL: Well developed, uncomfortable appearing, HEAD: Normocephalic/atraumatic EYES: EOMI/PERRL, no icterus ENMT: Mucous membranes dry NECK: supple no meningeal signs SPINE/BACK:entire spine nontender CV: S1/S2 noted, no murmurs/rubs/gallops noted LUNGS: Lungs are clear to auscultation bilaterally, no apparent distress ABDOMEN: soft, mild epigastric tenderness, no rebound or guarding, bowel sounds noted throughout abdomen GU:no cva tenderness NEURO: Pt is awake/alert/appropriate, moves all extremitiesx4.  No facial droop.   EXTREMITIES: pulses normal/equal, full ROM SKIN: warm, color normal PSYCH: Anxious  ED Treatments / Results  Labs (all labs ordered are listed, but only abnormal results are displayed) Labs Reviewed  CBC WITH DIFFERENTIAL/PLATELET - Abnormal; Notable for the following components:      Result Value   WBC 14.2 (*)    RBC 6.53 (*)    MCV 72.7 (*)    MCH 22.5 (*)    RDW 17.5 (*)    Neutro Abs 11.9 (*)    All other components within normal limits  COMPREHENSIVE METABOLIC PANEL - Abnormal; Notable for the following components:   Glucose, Bld 160 (*)    Total Protein 9.1 (*)  All other components within normal limits  LIPASE, BLOOD    EKG EKG Interpretation  Date/Time:  Monday September 19 2019 23:39:44 EST Ventricular Rate:  63 PR Interval:    QRS Duration: 89 QT Interval:  418 QTC Calculation: 428 R Axis:   79 Text Interpretation: Sinus rhythm Probable left ventricular hypertrophy ST elev, probable normal early repol pattern Confirmed by Ripley Fraise [phone]) on 09/19/2019 11:44:50 PM   Radiology No results found.  Procedures Procedures Medications Ordered in ED Medications  ondansetron (ZOFRAN) injection 4 mg (4 mg Intravenous  Given 09/19/19 2341)  sodium chloride 0.9 % bolus 1,000 mL (0 mLs Intravenous Stopped 09/20/19 0019)  metoCLOPramide (REGLAN) injection 10 mg (10 mg Intravenous Given 09/19/19 2355)  diphenhydrAMINE (BENADRYL) injection 25 mg (25 mg Intravenous Given 09/19/19 2355)  ondansetron (ZOFRAN) injection 4 mg (4 mg Intravenous Given 09/20/19 0141)  haloperidol lactate (HALDOL) injection 2 mg (2 mg Intravenous Given 09/20/19 0141)  sodium chloride 0.9 % bolus 1,000 mL (1,000 mLs Intravenous New Bag/Given 09/20/19 0308)  haloperidol lactate (HALDOL) injection 2 mg (2 mg Intravenous Given 09/20/19 9242)     Initial Impression / Assessment and Plan / ED Course  I have reviewed the triage vital signs and the nursing notes.  Pertinent labs  results that were available during my care of the patient were reviewed by me and considered in my medical decision making (see chart for details).        11:45 PM Patient reports nausea abdominal pain and vomiting since eating dinner at a [employer].  He is ill-appearing and diaphoretic.  EKG does not reveal any acute ST changes.  We will treat his nausea and reassess.  Labs are pending at this time 1:48 AM Patient had some initial improvement, but then began to vomit again.  He will be given Haldol.  He denies abdominal pain 4:50 AM Patient monitored for several hours.  He was given multiple rounds of antiemetics including Haldol Patient also given IV fluids.  Overall patient feels improved and he is taking p.o. fluids.  He never had any significant abdominal pain  Patient admits to near daily use of marijuana.  Cannabis hyperemesis syndrome is considered as he has had previous ER visits for vomiting  Overall patient is well-appearing no acute distress.  He is appropriate for discharge home. 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

## 2019-09-20 LAB — CBC WITH DIFFERENTIAL/PLATELET
HCT: 47.5 % (ref 39.0–52.0)
Hemoglobin: 14.7 g/dL (ref 13.0–17.0)
Lymphocytes Relative: 13 %
Lymphs Abs: 1.9 10*3/uL (ref 0.7–4.0)
MCH: 22.5 pg — ABNORMAL LOW (ref 26.0–34.0)
MCHC: 30.9 g/dL (ref 30.0–36.0)
MCV: 72.7 fL — ABNORMAL LOW (ref 80.0–100.0)
Monocytes Absolute: 0.4 10*3/uL (ref 0.1–1.0)
Monocytes Relative: 3 %
Neutro Abs: 11.9 10*3/uL — ABNORMAL HIGH (ref 1.7–7.7)
Neutrophils Relative %: 84 %
Platelets: 350 10*3/uL (ref 150–400)
RBC: 6.53 MIL/uL — ABNORMAL HIGH (ref 4.22–5.81)
RDW: 17.5 % — ABNORMAL HIGH (ref 11.5–15.5)
WBC: 14.2 10*3/uL — ABNORMAL HIGH (ref 4.0–10.5)
nRBC: 0 % (ref 0.0–0.2)

## 2019-09-20 LAB — COMPREHENSIVE METABOLIC PANEL
ALT: 19 U/L (ref 0–44)
AST: 28 U/L (ref 15–41)
Albumin: 4.7 g/dL (ref 3.5–5.0)
Alkaline Phosphatase: 92 U/L (ref 38–126)
Anion gap: 13 (ref 5–15)
BUN: 13 mg/dL (ref 6–20)
CO2: 24 mmol/L (ref 22–32)
Calcium: 9.8 mg/dL (ref 8.9–10.3)
Chloride: 100 mmol/L (ref 98–111)
Creatinine, Ser: 0.99 mg/dL (ref 0.61–1.24)
GFR calc Af Amer: >60 mL/min (ref >60)
GFR calc non Af Amer: >60 mL/min (ref >60)
Glucose, Bld: 160 mg/dL — ABNORMAL HIGH (ref 70–99)
Potassium: 3.9 mmol/L (ref 3.5–5.1)
Sodium: 137 mmol/L (ref 135–145)
Total Bilirubin: 0.6 mg/dL (ref 0.3–1.2)
Total Protein: 9.1 g/dL — ABNORMAL HIGH (ref 6.5–8.1)

## 2019-09-20 LAB — LIPASE, BLOOD: Lipase: 22 U/L (ref 11–51)

## 2019-09-20 MED ORDER — HALOPERIDOL LACTATE 5 MG/ML IJ SOLN
2.0000 mg | Freq: Once | INTRAMUSCULAR | Status: AC
  Administered 2019-09-20: 2 mg via INTRAVENOUS
  Filled 2019-09-20: qty 1

## 2019-09-20 MED ORDER — SODIUM CHLORIDE 0.9 % IV BOLUS (SEPSIS)
1000.0000 mL | Freq: Once | INTRAVENOUS | Status: AC
  Administered 2019-09-20: 1000 mL via INTRAVENOUS

## 2019-09-20 MED ORDER — DROPERIDOL 2.5 MG/ML IJ SOLN: 1.2500 mg | Freq: Once | INTRAMUSCULAR | Status: DC

## 2019-09-20 MED ORDER — ONDANSETRON HCL 4 MG/2ML IJ SOLN
4.0000 mg | Freq: Once | INTRAMUSCULAR | Status: AC
  Administered 2019-09-20: 4 mg via INTRAVENOUS
  Filled 2019-09-20: qty 2

## 2019-09-20 MED ORDER — PROMETHAZINE HCL 25 MG PO TABS: 25.0000 mg | ORAL_TABLET | Freq: Three times a day (TID) | ORAL | 0 refills | Status: AC | PRN

## 2019-09-20 NOTE — ED Notes (Signed)
Patient given ginger-ale to drink per Dr. Christy Gentles

## 2020-01-09 ENCOUNTER — Other Ambulatory Visit: Payer: Self-pay

## 2020-01-09 ENCOUNTER — Ambulatory Visit: Payer: Medicaid Other | Attending: Internal Medicine

## 2020-01-09 DIAGNOSIS — Z20822 Contact with and (suspected) exposure to covid-19: Secondary | ICD-10-CM

## 2020-01-10 LAB — NOVEL CORONAVIRUS, NAA: SARS-CoV-2, NAA: NOT DETECTED

## 2020-04-17 ENCOUNTER — Other Ambulatory Visit: Payer: Medicaid Other

## 2020-04-17 ENCOUNTER — Other Ambulatory Visit: Payer: Self-pay

## 2020-04-17 ENCOUNTER — Ambulatory Visit: Payer: Medicaid Other | Attending: Internal Medicine

## 2020-04-17 DIAGNOSIS — Z20822 Contact with and (suspected) exposure to covid-19: Secondary | ICD-10-CM

## 2020-04-18 ENCOUNTER — Telehealth: Payer: Self-pay | Admitting: *Deleted

## 2020-04-18 LAB — SARS-COV-2, NAA 2 DAY TAT

## 2020-04-18 LAB — NOVEL CORONAVIRUS, NAA: SARS-CoV-2, NAA: NOT DETECTED

## 2020-04-18 NOTE — Telephone Encounter (Signed)
patient called for results ,still pending.

## 2021-05-30 IMAGING — CT CT ABD-PELV W/ CM
2 of 4 series · 15 of 46 positions shown, 17 images · IV contrast (omnipaque)
Comparison: 11/25/2016

CLINICAL DATA: Nausea, vomiting, generalized pain

EXAM:
CT ABDOMEN AND PELVIS WITH CONTRAST
TECHNIQUE: Multidetector CT imaging of the abdomen and pelvis was performed
using the standard protocol following bolus administration of
intravenous contrast.
CONTRAST:  100mL OMNIPAQUE IOHEXOL 300 MG/ML  SOLN

[Series 2: axial st · axial · 0.70mm/px · z∈[-747,-347]mm · 12 of 92 slices shown, 14 images]
[im 6/92  soft-tissue]
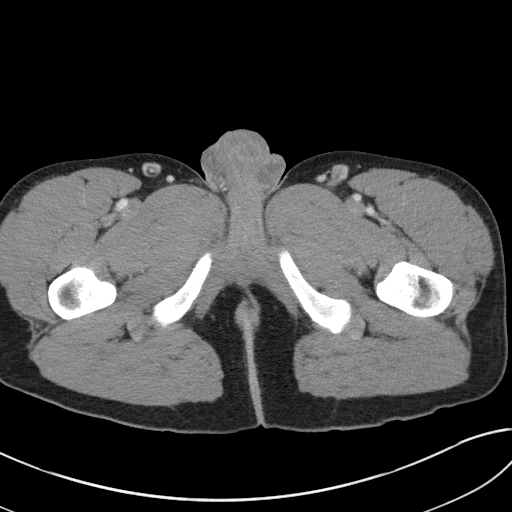
[im 6/92  bone]
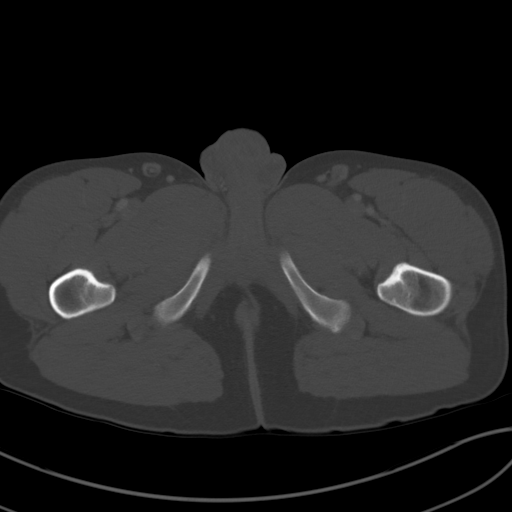
[im 17/92  soft-tissue]
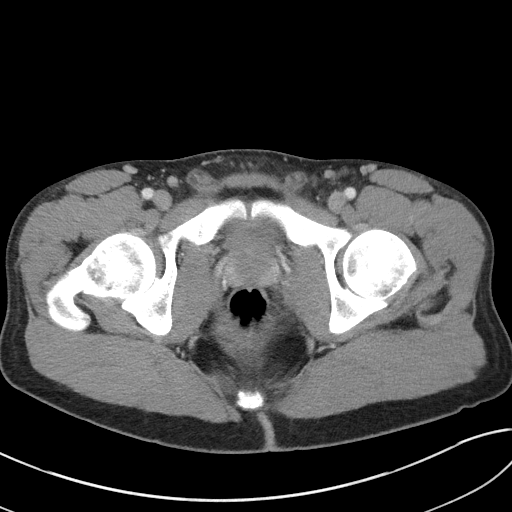
[im 22/92  soft-tissue]
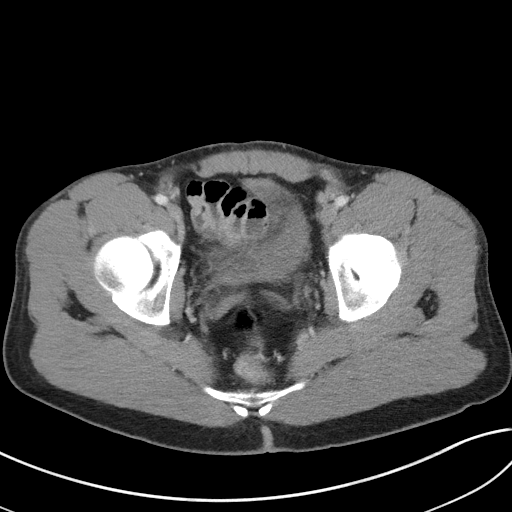
[im 27/92  soft-tissue]
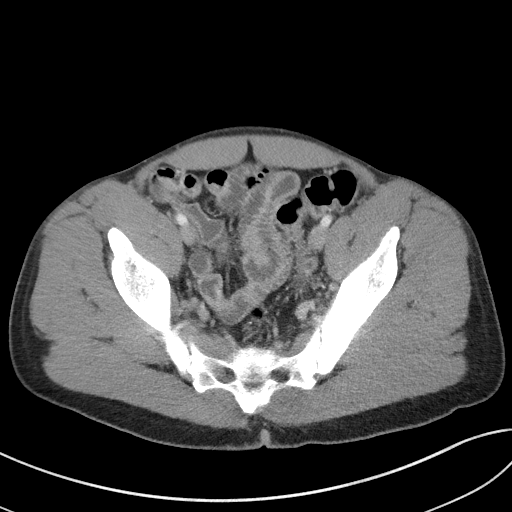
[im 38/92  soft-tissue]
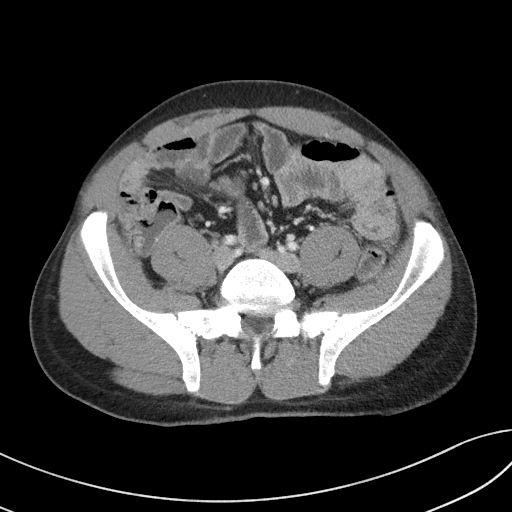
[im 43/92  soft-tissue]
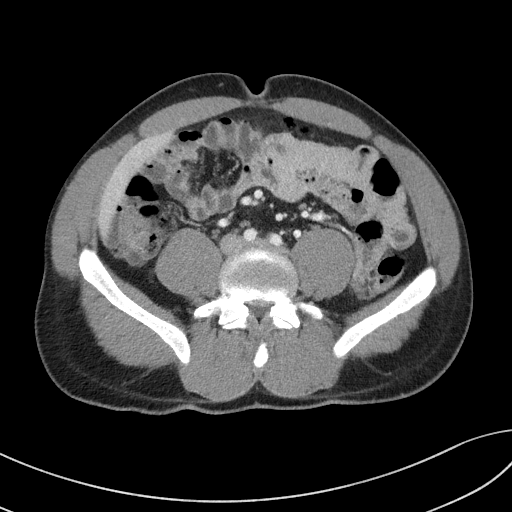
[im 49/92  soft-tissue]
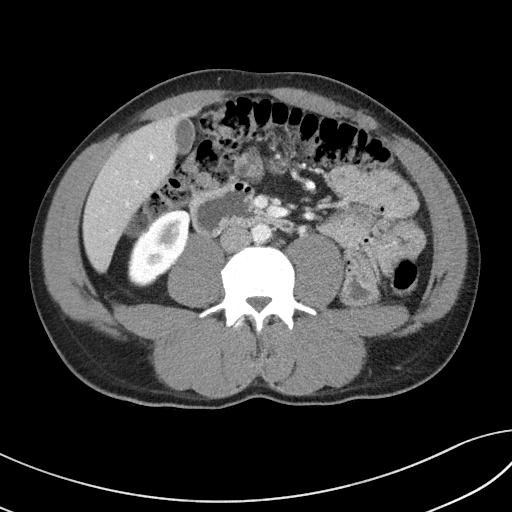
[im 59/92  soft-tissue]
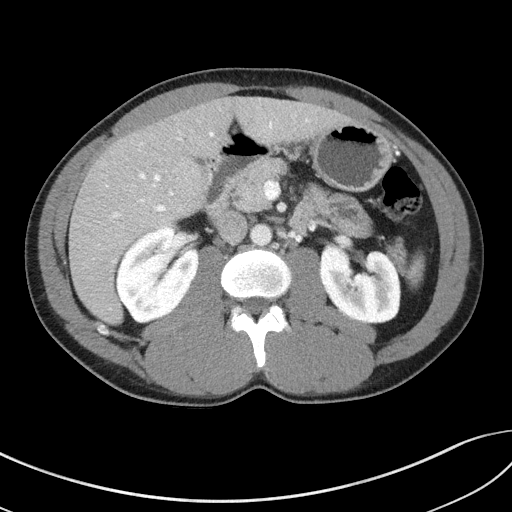
[im 65/92  soft-tissue]
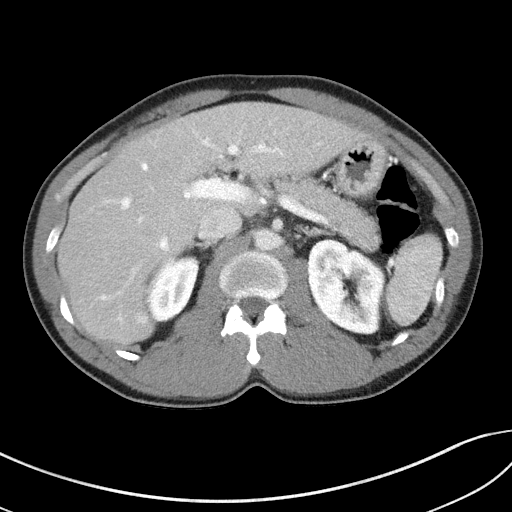
[im 65/92  bone]
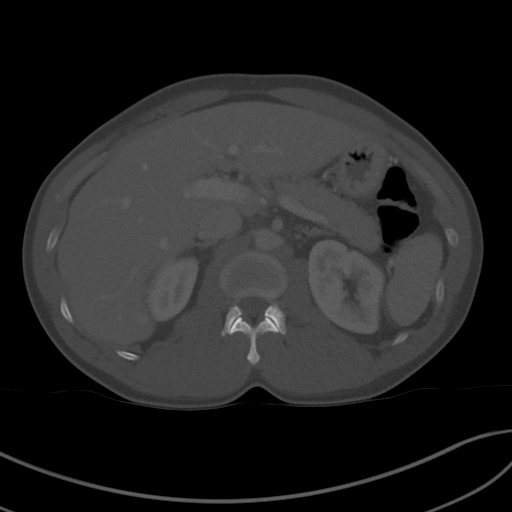
[im 70/92  soft-tissue]
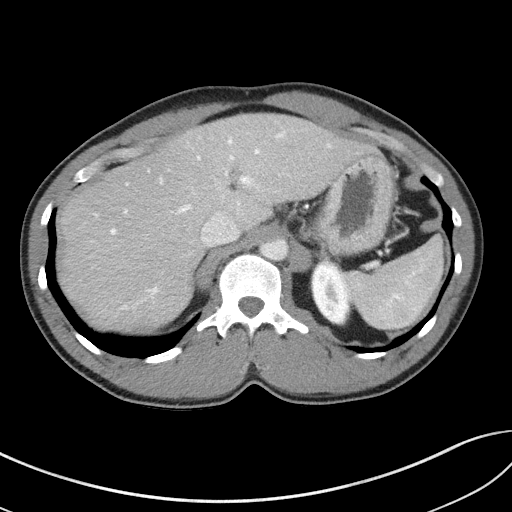
[im 81/92  soft-tissue]
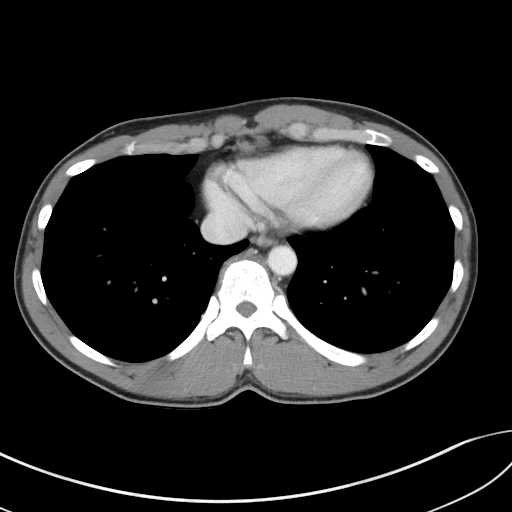
[im 86/92  soft-tissue]
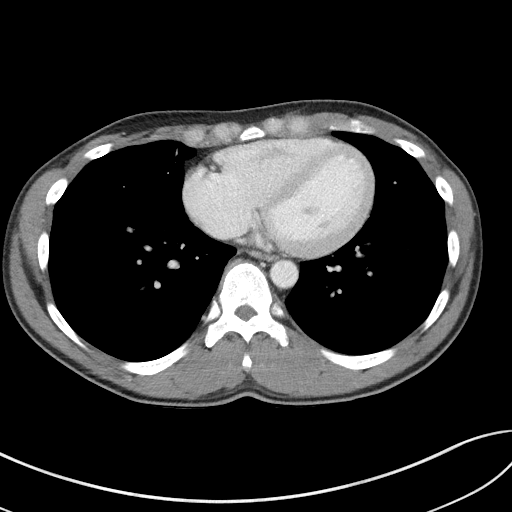

[Series 5: coronal st · coronal · 0.63mm/px · 3 of 82 slices shown]
[im 28/82  soft-tissue]
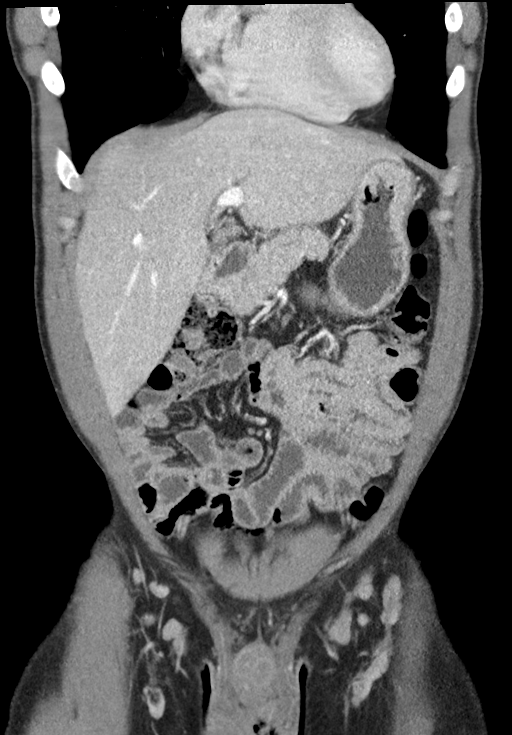
[im 37/82  soft-tissue]
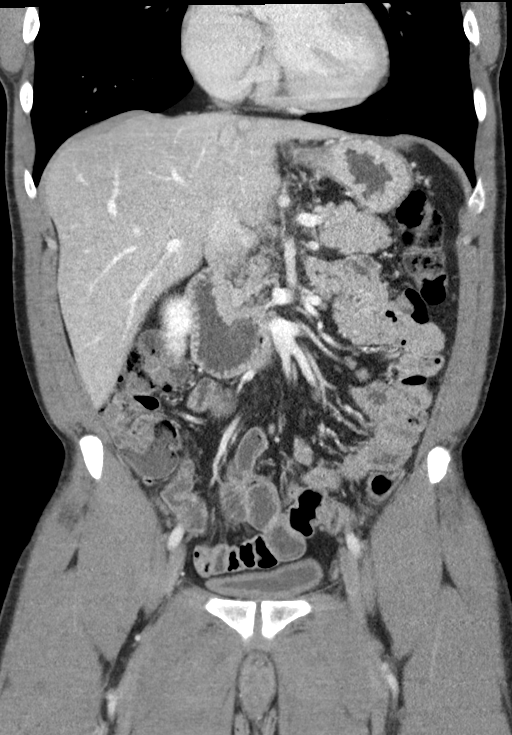
[im 46/82  soft-tissue]
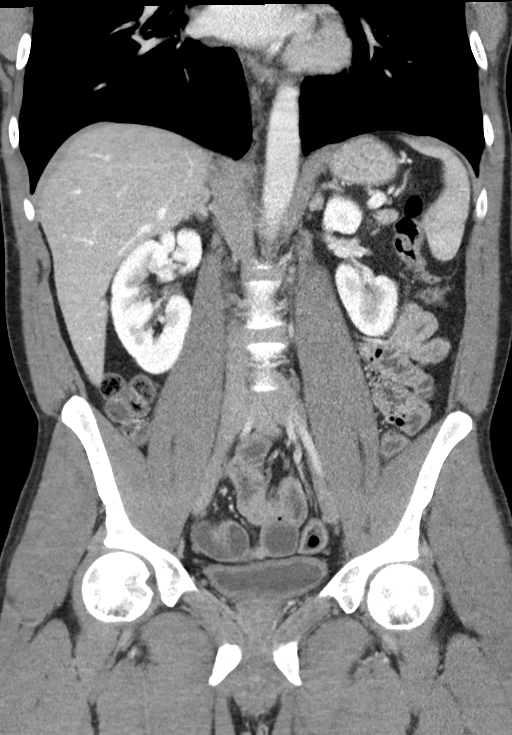

[15 of 46 positions shown; findings below may reference images not displayed]

FINDINGS: Lower chest: No acute abnormality.

Hepatobiliary: No focal liver abnormality is seen. No gallstones,
gallbladder wall thickening, or biliary dilatation.

Pancreas: Unremarkable. No pancreatic ductal dilatation or
surrounding inflammatory changes.

Spleen: Normal in size without focal abnormality.

Adrenals/Urinary Tract: Adrenal glands are unremarkable. Kidneys are
normal, without renal calculi, focal lesion, or hydronephrosis.
Bladder is unremarkable.

Stomach/Bowel: Stomach is within normal limits. Appendix appears
normal. No evidence of bowel wall thickening, distention, or
inflammatory changes. Small amount of fluid in the stomach and
fluid-filled loops of small bowel and colon a small amount of fluid
in the colon as can be seen with enterocolitis.

Vascular/Lymphatic: No significant vascular findings are present. No
enlarged abdominal or pelvic lymph nodes.

Reproductive: Prostate is unremarkable.

Other: No abdominal wall hernia or abnormality. No abdominopelvic
ascites.

Musculoskeletal: Stable bilateral sacroiliitis with subchondral
sclerosis. No aggressive osseous lesion. No periosteal reaction or
bone destruction.
IMPRESSION: 1. Small amount of fluid in the stomach and fluid-filled loops of
small bowel and colon a small amount of fluid in the colon as can be
seen with enterocolitis.
2. Stable bilateral sacroiliitis which may be secondary to
osteoarthritis versus an inflammatory arthropathy or enteropathic
arthritis

## 2022-08-09 ENCOUNTER — Other Ambulatory Visit: Payer: Self-pay

## 2022-08-09 ENCOUNTER — Encounter (HOSPITAL_COMMUNITY): Payer: Self-pay

## 2022-08-09 ENCOUNTER — Emergency Department (HOSPITAL_COMMUNITY)
Admission: EM | Admit: 2022-08-09 | Discharge: 2022-08-09 | Disposition: A | Discharge status: Home / Self Care | Payer: BC Managed Care – PPO | Attending: Emergency Medicine | Admitting: Emergency Medicine

## 2022-08-09 DIAGNOSIS — Z20822 Contact with and (suspected) exposure to covid-19: Secondary | ICD-10-CM | POA: Insufficient documentation

## 2022-08-09 DIAGNOSIS — R059 Cough, unspecified: Secondary | ICD-10-CM | POA: Diagnosis present

## 2022-08-09 DIAGNOSIS — J029 Acute pharyngitis, unspecified: Secondary | ICD-10-CM | POA: Diagnosis not present

## 2022-08-09 DIAGNOSIS — R1110 Vomiting, unspecified: Secondary | ICD-10-CM | POA: Insufficient documentation

## 2022-08-09 DIAGNOSIS — J111 Influenza due to unidentified influenza virus with other respiratory manifestations: Secondary | ICD-10-CM

## 2022-08-09 LAB — RESP PANEL BY RT-PCR (FLU A&B, COVID) ARPGX2
Influenza A by PCR: NEGATIVE
Influenza B by PCR: NEGATIVE
SARS Coronavirus 2 by RT PCR: NEGATIVE

## 2022-08-09 NOTE — ED Triage Notes (Signed)
Patient reports headache, runny nose, fever, cough. States that he was exposed to children this week that have been diagnosed with Flu. States that Wednesday he developed N/V. Sore throat now that he states is mild.

## 2022-08-09 NOTE — ED Provider Notes (Signed)
  [facility] EMERGENCY DEPARTMENT Provider Note   CSN: [id number] Arrival date & time: 08/09/22  7096     History {Add pertinent medical, surgical, social history, OB history to HPI:1} Chief Complaint  Patient presents with   Cough    Troy Ramos is a 41 y.o. male.  Patient states that he has had a cough for a few days and had some vomiting and a little sore throat.  Patient is feeling better now   Cough      Home Medications Prior to Admission medications   Medication Sig Start Date End Date Taking? Authorizing Provider  promethazine (PHENERGAN) 25 MG tablet Take 1 tablet (25 mg total) by mouth every 8 (eight) hours as needed for nausea or vomiting. 09/20/19   Ripley Fraise, MD      Allergies    Pumpkin flavor and Ibuprofen    Review of Systems   Review of Systems  Respiratory:  Positive for cough.     Physical Exam Updated Vital Signs BP (!) 127/98   Pulse 65   Temp 97.9 F (36.6 C) (Oral)   Resp 18   SpO2 100%  Physical Exam  ED Results / Procedures / Treatments   Labs (all labs ordered are listed, but only abnormal results are displayed) Labs Reviewed  RESP PANEL BY RT-PCR (FLU A&B, COVID) ARPGX2    EKG None  Radiology No results found.  Procedures Procedures  {Document cardiac monitor, telemetry assessment procedure when appropriate:1}  Medications Ordered in ED Medications - No data to display  ED Course/ Medical Decision Making/ A&P                           Medical Decision Making  Patient with viral syndrome.  He will be discharged home and will take Tylenol fluids  {Document critical care time when appropriate:1} {Document review of labs and clinical decision tools ie heart score, Chads2Vasc2 etc:1}  {Document your independent review of radiology images, and any outside records:1} {Document your discussion with family members, caretakers, and with consultants:1} {Document social determinants of health affecting pt's  care:1} {Document your decision making why or why not admission, treatments were needed:1} Final Clinical Impression(s) / ED Diagnoses Final diagnoses:  Influenza-like illness    Rx / DC Orders ED Discharge Orders     None

## 2022-08-09 NOTE — Discharge Instructions (Signed)
Take Tylenol or Motrin for fever and aches drink plenty of fluids and follow-up with your doctor next week if not improving

## 2023-01-08 ENCOUNTER — Telehealth: Payer: Self-pay

## 2023-01-08 NOTE — Telephone Encounter (Signed)
Mychart msg sent

## 2024-07-21 ENCOUNTER — Other Ambulatory Visit: Payer: Self-pay

## 2024-07-21 ENCOUNTER — Encounter (HOSPITAL_COMMUNITY): Payer: Self-pay

## 2024-07-21 ENCOUNTER — Emergency Department (HOSPITAL_COMMUNITY)
Admission: EM | Admit: 2024-07-21 | Discharge: 2024-07-21 | Disposition: A | Discharge status: Home / Self Care | Attending: Emergency Medicine | Admitting: Emergency Medicine

## 2024-07-21 DIAGNOSIS — R112 Nausea with vomiting, unspecified: Secondary | ICD-10-CM | POA: Insufficient documentation

## 2024-07-21 DIAGNOSIS — R197 Diarrhea, unspecified: Secondary | ICD-10-CM | POA: Diagnosis not present

## 2024-07-21 LAB — COMPREHENSIVE METABOLIC PANEL WITH GFR
ALT: 20 U/L (ref 0–44)
AST: 24 U/L (ref 15–41)
Albumin: 4.2 g/dL (ref 3.5–5.0)
Alkaline Phosphatase: 66 U/L (ref 38–126)
Anion gap: 10 (ref 5–15)
BUN: 12 mg/dL (ref 6–20)
CO2: 23 mmol/L (ref 22–32)
Calcium: 9.3 mg/dL (ref 8.9–10.3)
Chloride: 108 mmol/L (ref 98–111)
Creatinine, Ser: 1.01 mg/dL (ref 0.61–1.24)
GFR, Estimated: >60 mL/min (ref >60)
Glucose, Bld: 142 mg/dL — ABNORMAL HIGH (ref 70–99)
Potassium: 4.1 mmol/L (ref 3.5–5.1)
Sodium: 141 mmol/L (ref 135–145)
Total Bilirubin: 0.4 mg/dL (ref 0.0–1.2)
Total Protein: 7.6 g/dL (ref 6.5–8.1)

## 2024-07-21 LAB — LIPASE, BLOOD: Lipase: 32 U/L (ref 11–51)

## 2024-07-21 LAB — URINALYSIS, ROUTINE W REFLEX MICROSCOPIC
Bacteria, UA: NONE SEEN
Bilirubin Urine: NEGATIVE
Glucose, UA: NEGATIVE mg/dL
Ketones, ur: NEGATIVE mg/dL
Leukocytes,Ua: NEGATIVE
Nitrite: NEGATIVE
Protein, ur: NEGATIVE mg/dL
Specific Gravity, Urine: 1.017 (ref 1.005–1.030)
pH: 5 (ref 5.0–8.0)

## 2024-07-21 LAB — RESP PANEL BY RT-PCR (RSV, FLU A&B, COVID)  RVPGX2
Influenza A by PCR: NEGATIVE
Influenza B by PCR: NEGATIVE
Resp Syncytial Virus by PCR: NEGATIVE
SARS Coronavirus 2 by RT PCR: NEGATIVE

## 2024-07-21 LAB — CBC
HCT: 44.4 % (ref 39.0–52.0)
Hemoglobin: 14.3 g/dL (ref 13.0–17.0)
MCH: 23.2 pg — ABNORMAL LOW (ref 26.0–34.0)
MCHC: 32.2 g/dL (ref 30.0–36.0)
MCV: 72.1 fL — ABNORMAL LOW (ref 80.0–100.0)
Platelets: 333 K/uL (ref 150–400)
RBC: 6.16 MIL/uL — ABNORMAL HIGH (ref 4.22–5.81)
RDW: 17.1 % — ABNORMAL HIGH (ref 11.5–15.5)
WBC: 10.6 K/uL — ABNORMAL HIGH (ref 4.0–10.5)
nRBC: 0 % (ref 0.0–0.2)

## 2024-07-21 LAB — CBG MONITORING, ED: Glucose-Capillary: 126 mg/dL — ABNORMAL HIGH (ref 70–99)

## 2024-07-21 MED ORDER — FAMOTIDINE IN NACL 20-0.9 MG/50ML-% IV SOLN
20.0000 mg | Freq: Once | INTRAVENOUS | Status: AC
  Administered 2024-07-21: 20 mg via INTRAVENOUS
  Filled 2024-07-21: qty 50

## 2024-07-21 MED ORDER — SODIUM CHLORIDE 0.9 % IV SOLN
12.5000 mg | Freq: Once | INTRAVENOUS | Status: AC
  Administered 2024-07-21: 12.5 mg via INTRAVENOUS
  Filled 2024-07-21: qty 0.5

## 2024-07-21 MED ORDER — DROPERIDOL 2.5 MG/ML IJ SOLN
0.6250 mg | Freq: Once | INTRAMUSCULAR | Status: AC
  Administered 2024-07-21: 0.625 mg via INTRAVENOUS
  Filled 2024-07-21: qty 2

## 2024-07-21 MED ORDER — SODIUM CHLORIDE 0.9 % IV BOLUS
1000.0000 mL | Freq: Once | INTRAVENOUS | Status: AC
  Administered 2024-07-21: 1000 mL via INTRAVENOUS

## 2024-07-21 MED ORDER — ONDANSETRON HCL 4 MG/2ML IJ SOLN
4.0000 mg | Freq: Once | INTRAMUSCULAR | Status: AC
  Administered 2024-07-21: 4 mg via INTRAVENOUS
  Filled 2024-07-21: qty 2

## 2024-07-21 MED ORDER — ONDANSETRON 4 MG PO TBDP: 4.0000 mg | ORAL_TABLET | Freq: Four times a day (QID) | ORAL | 0 refills | Status: AC | PRN

## 2024-07-21 NOTE — ED Triage Notes (Signed)
 Pt came in POV with complaints of vomiting and diarrhea since this morning.

## 2024-07-21 NOTE — Discharge Instructions (Addendum)
 It was a pleasure taking care of you, you were seen today for nausea and vomiting.  This could be related to viral illness, or someone more likely is related to cannabinoid hyperemesis syndrome since you have had this several times in the past that felt exactly the same.  I am prescribing you nausea medicine for home, sometimes hot shower can help your symptoms as well, make sure you drink plenty of fluids.  It is very important for you to stop using marijuana, as this is the only way to stop this from happening again.  Follow-up with your primary care doctor, come back to the ER if you have new or worsening symptoms.

## 2024-07-21 NOTE — ED Provider Notes (Signed)
 [place] EMERGENCY DEPARTMENT AT [facility] Provider Note   CSN: [id number] Arrival date & time: 07/21/24  9085     Patient presents with: Emesis and Diarrhea   Troy Ramos is a 43 y.o. male.  He has history of chronic back pain.  Presents to ER for nausea vomiting diarrhea that started suddenly this morning, denies fever or chills, denies abdominal pain.  Denies urinary symptoms.  Nobody else in his house with similar symptoms.  He is here with his wife who states that they ate at a local restaurant last night, patient ate a [employer] burger, nobody else ate the same meal but nobody else was at the same restaurant, nobody else got sick.  Denies any blood in his vomit or stool.  Denies alleviating factors.    Emesis Associated symptoms: diarrhea   Diarrhea Associated symptoms: vomiting        Prior to Admission medications   Medication Sig Start Date End Date Taking? Authorizing Provider  promethazine  (PHENERGAN ) 25 MG tablet Take 1 tablet (25 mg total) by mouth every 8 (eight) hours as needed for nausea or vomiting. 09/20/19   Midge Golas, MD    Allergies: Pumpkin flavoring agent (non-screening) and Ibuprofen    Review of Systems  Gastrointestinal:  Positive for diarrhea and vomiting.    Updated Vital Signs BP (!) 148/96 (BP Location: Left Arm)   Pulse 69   Temp (!) 97.5 F (36.4 C) (Oral)   Resp 18   Ht 5' 7 (1.702 m)   Wt 68.5 kg   SpO2 99%   BMI 23.65 kg/m   Physical Exam Vitals and nursing note reviewed.  Constitutional:      General: He is not in acute distress.    Appearance: He is well-developed. He is not toxic-appearing or diaphoretic.     Comments: Appears to be uncomfortable, not currently vomiting but spitting into an emesis bag.  HENT:     Head: Normocephalic and atraumatic.     Mouth/Throat:     Mouth: Mucous membranes are moist.  Eyes:     Conjunctiva/sclera: Conjunctivae normal.  Cardiovascular:     Rate and Rhythm:  Normal rate and regular rhythm.     Heart sounds: No murmur heard. Pulmonary:     Effort: Pulmonary effort is normal. No respiratory distress.     Breath sounds: Normal breath sounds.  Abdominal:     Palpations: Abdomen is soft.     Tenderness: There is no abdominal tenderness.  Musculoskeletal:        General: No swelling.     Cervical back: Neck supple.  Skin:    General: Skin is warm and dry.     Capillary Refill: Capillary refill takes less than 2 seconds.  Neurological:     General: No focal deficit present.     Mental Status: He is alert and oriented to person, place, and time.  Psychiatric:        Mood and Affect: Mood normal.     (all labs ordered are listed, but only abnormal results are displayed) Labs Reviewed  LIPASE, BLOOD  COMPREHENSIVE METABOLIC PANEL WITH GFR  CBC  URINALYSIS, ROUTINE W REFLEX MICROSCOPIC    EKG: None  Radiology: No results found.   Procedures   Medications Ordered in the ED  sodium chloride  0.9 % bolus 1,000 mL (has no administration in time range)  promethazine  (PHENERGAN ) 12.5 mg in sodium chloride  0.9 % 50 mL IVPB (has no administration in time  range)  ondansetron  (ZOFRAN ) injection 4 mg (4 mg Intravenous Given 07/21/24 0937)                                    Medical Decision Making Differential diagnosis includes but not limited to gastritis, gastroenteritis, colitis, DKA, cannabinoid hyperemesis syndrome, other  Course: Patient presents with nausea vomiting diarrhea that developed suddenly this morning.  He has no abdominal tenderness on exam.  He had gotten Zofran  already prior to my evaluation and still having nausea so we will give Phenergan  and reassess.  Awaiting labs.  Patient still with nausea and vomiting after Phenergan , given droperidol  and he is feeling much better, tolerating p.o.  We discussed given persistent vomiting the need for possible CT scan but his abdomen completely soft and nontender and he states he  has had this multiple times in the past, has been told this is likely related to daily cannabis use.  We discussed that this would be consistent with his symptoms, he does not want imaging at this time.  I feel this is reasonable since his symptoms are not well-controlled.  We discussed marijuana cessation.  He is advised on follow-up and strict return precautions.  Amount and/or Complexity of Data Reviewed Labs: ordered.    Details: White blood cell 10.6, UA normal, CMP normal, COVID flu RSV negative  Risk Prescription drug management.        Final diagnoses:  None    ED Discharge Orders     None          Suellen Sherran DELENA DEVONNA 07/21/24 1232    Towana Ozell BROCKS, MD 07/21/24 1747

## 2024-11-09 ENCOUNTER — Ambulatory Visit: Admitting: Physician Assistant
